# Patient Record
Sex: Male | Born: 1935
Health system: Southern US, Community
[De-identification: ages and names within clinical notes are randomized; demographics above are authoritative.]

## PROBLEM LIST (undated history)

## (undated) DIAGNOSIS — I82401 Acute embolism and thrombosis of unspecified deep veins of right lower extremity: Secondary | ICD-10-CM

## (undated) DIAGNOSIS — E119 Type 2 diabetes mellitus without complications: Secondary | ICD-10-CM

## (undated) DIAGNOSIS — C61 Malignant neoplasm of prostate: Secondary | ICD-10-CM

## (undated) DIAGNOSIS — I5189 Other ill-defined heart diseases: Secondary | ICD-10-CM

## (undated) DIAGNOSIS — I10 Essential (primary) hypertension: Secondary | ICD-10-CM

## (undated) DIAGNOSIS — I4891 Unspecified atrial fibrillation: Secondary | ICD-10-CM

## (undated) DIAGNOSIS — I2692 Saddle embolus of pulmonary artery without acute cor pulmonale: Secondary | ICD-10-CM

---

## 2008-08-02 ENCOUNTER — Inpatient Hospital Stay (HOSPITAL_COMMUNITY): Admission: EM | Admit: 2008-08-02 | Discharge: 2008-08-06 | Payer: Self-pay | Admitting: Emergency Medicine

## 2008-08-11 ENCOUNTER — Encounter: Admission: RE | Admit: 2008-08-11 | Discharge: 2008-08-11 | Payer: Self-pay | Admitting: Geriatric Medicine

## 2008-08-12 ENCOUNTER — Inpatient Hospital Stay (HOSPITAL_COMMUNITY): Admission: AD | Admit: 2008-08-12 | Discharge: 2008-08-17 | Payer: Self-pay | Admitting: Internal Medicine

## 2008-08-16 ENCOUNTER — Encounter (INDEPENDENT_AMBULATORY_CARE_PROVIDER_SITE_OTHER): Payer: Self-pay | Admitting: Internal Medicine

## 2008-10-18 ENCOUNTER — Encounter: Admission: RE | Admit: 2008-10-18 | Discharge: 2008-10-18 | Payer: Self-pay | Admitting: Geriatric Medicine

## 2009-01-28 ENCOUNTER — Encounter: Admission: RE | Admit: 2009-01-28 | Discharge: 2009-01-28 | Payer: Self-pay | Admitting: Geriatric Medicine

## 2011-02-06 NOTE — H&P (Signed)
[name], MATTERS NO.:  [id number]   MEDICAL RECORD NO.:  [id number]          PATIENT TYPE:  EMS   LOCATION:  MAJO                         FACILITY:  [facility]   PHYSICIAN:  Hollice Espy, M.D.DATE OF BIRTH:  03-Mar-1919   DATE OF ADMISSION:  08/02/2008  DATE OF DISCHARGE:                              HISTORY & PHYSICAL   PRIMARY CARE PHYSICIAN:  Dr. Merlene Laughter.   CHIEF COMPLAINT:  Cough and shortness of breath.   HISTORY OF PRESENT ILLNESS:  The patient is an 77 year old white male  with past medical history of diabetes and hypertension who for the past  5 days has had problems with progressively worsening shortness of breath  and productive cough of brownish-greenish sputum as well as some  hemoptysis.  Things got bad.  The patient felt very weak, could barely  move and came to the emergency room for further evaluation.  In the  emergency room, he was noted on admission to have a temperature of 99.7,  heart rate of 110 and respiratory rate of 22.  His O2 saturations were,  however, ample at 97% on 2 L.  Labs were ordered on the patient.  He was  found to have a white count 15.9 with a 95% shift, greater than 20%  bandemia, vacuolated neutrophils indicating early signs of sepsis.  His  other labs were essentially unremarkable except for an albumin of 2.3,  INR of 1.3, a CPK greater than 500 with MB of 10.8, normal troponin.  The patient was given a dose of Rocephin.  Currently, he is feeling  weak.  He denies any headaches, vision changes, dysphagia, chest pain,  palpitations.  He does complain of shortness of breath with wheezing and  productive cough with green, brown and red sputum.  He complains of no  abdominal pain, does feel nauseated and says that earlier on, he was  having diarrhea for several days but has not had any for the last 1-1/2  days.  He denies any constipation.  He denies any hematuria or dysuria,  no focal extremity numbness,  weakness or pain.  Overall, he feels very  fatigued.   REVIEW OF SYSTEMS:  Otherwise negative.   PAST MEDICAL HISTORY:  Includes diabetes mellitus, type 2, poorly  controlled, hypertension and GERD.   MEDICATIONS:  1. Lisinopril 10.  2. Loratadine 10.  3. Glucosamine daily.  4. Aspirin 81.  5. Glipizide 2.5 daily.  6. Finasteride 5.  7. Prilosec 20.   He has no known drug allergies, although he reports food allergies to  CHOCOLATE and SHRIMP.   SOCIAL HISTORY:  He denies any tobacco, alcohol or drug use.  He and his  wife recently relocated from [place] where they lived at the [facility].   FAMILY HISTORY:  Noncontributory.   PHYSICAL EXAMINATION:  VITALS ON ADMISSION:  Temperature 99.7, heart  rate 110, blood pressure 119/72, respirations 22, O2 saturation 96% on 2  L.  IN GENERAL:  He is alert and oriented x3, in no apparent distress.  HEENT:  Normocephalic, atraumatic.  His mucous  membranes are slightly  dry.  NECK:  He has no carotid bruits.  HEART:  Irregular rhythm, mild tachycardia.  LUNGS:  He has some scattered rales and wheezing.  ABDOMEN:  Soft, nontender, nondistended.  Positive bowel sounds.  EXTREMITIES:  Show no clubbing or cyanosis.  He has a 1+ pitting edema  bilaterally from the knees down.   LABORATORY WORK:  White count 15.9, hemoglobin and hematocrit 12.6 and  38 respectively, MCV of 92, platelet count 236, 95% shift, vacuolated  neutrophils, and greater than 20% bandemia is present.  Sodium 136,  potassium 4.3, chloride 107, bicarb 20, BUN 24, creatinine 1.08, glucose  115.  LFTs are noted for albumin of 2.3.  INR is elevated at 1.3 with a  PTT of 54.  UA notes small leukocyte esterase, 100 of protein, 4 or  urobilinogen, otherwise unremarkable.  CPK greater than 500, MB 10.8,  troponin-I of 0.05.   ASSESSMENT AND PLAN:  1. Nursing home-acquired pneumonia with #2.  2. SIRS.  We will plan to treat with intravenous fluids, antibiotics,       specifically choosing vancomycin and Avelox given his nursing home      state plus oxygen and nebulizer treatments.  3. Diabetes mellitus.  Check an A1c, sliding scale insulin and      glipizide for now.  Depending on the patient's p.o. intake, will      adjust accordingly.  4. Hypertension.  Currently holding lisinopril given his slightly low      blood pressure on admission.  5. Gastroesophageal reflux disease.  Continue Prilosec.      Hollice Espy, M.D.  Electronically Signed     SKK/MEDQ  D:  08/02/2008  T:  08/02/2008  Job:  [id number]   cc:   Hal T. Stoneking, M.D.

## 2011-02-06 NOTE — H&P (Signed)
NAMEMAXIMILIAN, TALLO                 ACCOUNT NO.:  [id number]   MEDICAL RECORD NO.:  [id number]          PATIENT TYPE:  INP   LOCATION:  4728                         FACILITY:  [facility]   PHYSICIAN:  Hal T. Stoneking, M.D. DATE OF BIRTH:  December 03, 1918   DATE OF ADMISSION:  08/02/2008  DATE OF DISCHARGE:  08/06/2008                              HISTORY & PHYSICAL   ADMISSION DIAGNOSIS:  Pneumonia.   HISTORY OF PRESENT ILLNESS:  Mr. Wieck is a very pleasant 77 year old  white male who is a recent new resident at the [facility] in independent living.  He had a respiratory illness and was  admitted on August 02, 2008, with a left lower lobe pneumonia.  He was  treated in the hospital at that time.  He was seen to be improving and  was discharged on Avelox.  He has been seen in the clinic 2 times since  his discharge and has had persistent problems with fever, increasing  cough, and weakness.  He did have a BNP that was elevated at 319 and  normal BMET, these were drawn on August 11, 2008.  He was placed on  increasing doses of Lasix to 80 mg b.i.d. and has not had an effective  diuresis during that time.  He is seen this morning and again is having  increasing problems with weakness, shortness of breath, and occasionally  feeling hot and chilled.  He is coughing up yellow-tinged sputum.   PAST MEDICAL HISTORY:  He is allergic to the flu shot.  He did have a  Pneumovax in 2001.   CURRENT MEDICATIONS:  1. Lasix 80 mg b.i.d., which he has been receiving only for 24 hours.  2. Lisinopril 10 mg a day.  3. Glipizide 2.5 mg a day.  4. Finasteride 5 mg a day.  5. Omeprazole 20 mg b.i.d.  6. Aspirin 81 mg a day.  7. Glucosamine sulfate 2000 mg a day.   Past medical history remarkable for:  1. Diabetes mellitus.  2. Hypertension.  3. Prostate cancer in situ.  4. Peripheral neuropathy.  5. GERD.  6. Allergic rhinitis.   SURGICAL HISTORY:  1. Status post  appendectomy.  2. Cholecystectomy.  3. Prostate biopsy.   SOCIAL HISTORY:  The patient is married.  He lives at the [facility]  in independent living.  He is retired.  He stopped smoking in the 1950s.  He does not consume alcohol.   FAMILY HISTORY:  Brother died with esophageal cancer, another brother  had gastric cancer, and mother died at age 77 of colon cancer.  Father  died at age 72 of a stroke.   HEALTH MAINTENANCE:  He states that has been over 5 years since he has  had a colonoscopy.   REVIEW OF SYSTEMS:  Currently, no headache.  States his bowels are  moving normally.  He has had no nausea or vomiting.   PHYSICAL EXAMINATION:  VITAL SIGNS:  Weight 182, temperature 97.8, O2  sat 91% on room air, blood pressure 170/60, pulse 92.  HEENT:  Unremarkable.  LUNGS:  Crackles and decreased breath sounds at the left base.  HEART:  Regular rate and rhythm without murmur.  ABDOMEN:  Obese, soft, nontender.  LEGS:  A 3+ pedal edema.   LABORATORY DATA:  Chest x-ray repeated on August 11, 2008, revealed  worsening left greater than right airspace opacities compatible with  worsening pneumonia.   ASSESSMENT:  1. Persistent community-acquired pneumonia currently failing      outpatient Avelox treatment after inpatient treatment.  We would      possibly consider infectious disease input regarding antibiotic      coverage at this point.  2. Diabetes mellitus, follow CBGs.  3. Increased volume status by physical exam and BNP although that may      be misleading given the fact he has pneumonia.  No prior history of      congestive heart failure.   PLAN:  Please admit and begin IV Lasix 80 mg b.i.d., follow his CBGs and  await for input regarding antibiotic coverage.           ______________________________  Sunday Spillers Pete Glatter, M.D.     HTS/MEDQ  D:  08/12/2008  T:  08/12/2008  Job:  [id number]

## 2011-02-06 NOTE — Discharge Summary (Signed)
Matthew Velez, Matthew Velez                 ACCOUNT NO.:  [id number]   MEDICAL RECORD NO.:  [id number]          PATIENT TYPE:  INP   LOCATION:  5013                         FACILITY:  [facility]   PHYSICIAN:  Kela Millin, M.D.DATE OF BIRTH:  07/01/1919   DATE OF ADMISSION:  08/12/2008  DATE OF DISCHARGE:  08/17/2008                               DISCHARGE SUMMARY   DISCHARGE DIAGNOSES:  1. Bilateral pneumonia, left greater than right.  2. Fluid overload/peripheral edema.  Improved with Lasix, 2-D echo      results pending at the time of this dictation.  3. Hypokalemia.  Potassium replaced.  4. Diabetes mellitus.  5. Hypertension.  6. History of prostate carcinoma in situ.  7. Peripheral neuropathy.  8. Gastroesophageal reflux disease.  9. History of allergic rhinitis.   STUDIES:  Chest x-ray on 11/18, worsening left greater than right air  space opacities compatible with worsening pneumonia.   BRIEF HISTORY:  The patient is an 77 year old white male resident of  [facility]  independent living who presented with complaints of increasing  cough, weakness and fevers.  It was noted that the patient had had a  respiratory illness and was admitted to the hospital August 02, 2008,  and his x-ray showed a left lower lobe pneumonia treated with  antibiotics, and as he was improving, he was discharged home on Avelox.  He followed up at the clinic after his discharge and had lab work on  November 18 that showed a brain natruretic peptide of 319.  A chest x-  ray was done, and the results as stated above.  The patient was started  on p.o. Lasix 24 hours prior to his readmission.  His symptoms were  continuing to worsen and he was directly admitted for further evaluation  and management.   Please see the full admission history and physical dictated on August 12, 2008, for the details of the admission and physical exam as well as  the laboratory data.   HOSPITAL COURSE:  1. Bilateral pneumonia.   Upon admission, the patient was empirically      started on vancomycin and Zosyn to cover for health care associated      pneumonia after blood and sputum cultures were drawn.  The blood      cultures to date are negative, and the patient's sputum showed gram      positive cocci in pairs but no resistant organisms reported.  He      has remained afebrile throughout his hospital stay and his      leukocytosis has resolved.  His last white cell count prior to      discharge is 9.0.  His energy levels are improved and he is      tolerating p.o. well.  With his blood cultures negative and no      resistant organisms in his sputum, the vancomycin was discontinued.      He was monitored on the Zosyn and has remained afebrile.  He will      be discharged on Augmentin, and he is to follow up with  his primary      care physician, he has been oxygenating well on room air.  2. Volume overload/peripheral edema.  Upon admission, the patient was      started on IV Lasix.  He has diuresed well with significant      improvement of his peripheral edema and his Lasix was changed to      p.o.  His creatinine went from 1.19-1.32 and so his Lasix dose has      been decreased from 80 mg b.i.d. to 40 mg b.i.d.  He is to follow      up with his primary care physician and his renal function to be      monitored and the Lasix a dose adjusted as appropriate.  A 2-D      echocardiogram was also done while the patient was in the hospital      and the results are pending at the time of this dictation, he is to      follow up with Dr. Pete Glatter for these results.  3. Hypokalemia.  Potassium was replaced in the hospital.  4. Hypertension.  He is to continue his outpatient medications upon      discharge.  5. Diabetes.  Was maintained on glipizide during his hospital stay.  6. GERD.  He was maintained on a PPI and is to continue his omeprazole      upon discharge.  7. History of prostate cancer in situ, is to follow up  outpatient.   DISCHARGE MEDICATIONS:  1. Augmentin 875 mg p.o. daily for eight more days.  2. Lasix 40 mg p.o. b.i.d.  3. Patient to continue glipizide 2.5 mg daily.  4. Lisinopril 10 mg daily.  5. Finasteride 5 mg daily.  6. Omeprazole 20 mg b.i.d.  7. Aspirin 81 mg daily.  8. Glucosamine sulfate 2000 mg per day.   PENDING LABORATORY DATA:  Echocardiogram.   FOLLOW-UP CARE:  1. Dr. Merlene Laughter in 3-5 days, recheck b-met for followup of BUN      and creatinine.   DISCHARGE CONDITION:  Improved.  Stable.  Home health PT is to follow  the patient.      Kela Millin, M.D.  Electronically Signed     ACV/MEDQ  D:  08/17/2008  T:  08/17/2008  Job:  [id number]   cc:   Hal T. Stoneking, M.D.

## 2011-02-09 NOTE — Discharge Summary (Signed)
Matthew Velez, Matthew Velez                 ACCOUNT NO.:  [id number]   MEDICAL RECORD NO.:  [id number]          PATIENT TYPE:  INP   LOCATION:  4728                         FACILITY:  [facility]   PHYSICIAN:  Corinna L. Lendell Caprice, MDDATE OF BIRTH:  06-May-1919   DATE OF ADMISSION:  08/02/2008  DATE OF DISCHARGE:  08/06/2008                               DISCHARGE SUMMARY   DISCHARGE DIAGNOSES:  1. Left upper lobe pneumonia.  2. Diabetes, type 2.  3. Hypertension.  4. Gastroesophageal reflux disease.   DISCHARGE MEDICATIONS:  1. Avelox 400 mg daily until gone.  2. Tessalon Perles one or two every 8 hours as needed for cough.  3. Continue lisinopril 10 mg a day.  4. Glipizide 2.5 mg a day.  5. Finasteride 5 mg a day.  6. Omeprazole 20 mg a day.  7. Aspirin 81 mg a day.  8. Glucosamine loratadine as needed.   CONDITION:  Stable.   Followup with Dr. Corbin Ade in 4 weeks.  He will need a repeat chest x-  ray in 4-6 weeks.  Increase activity slowly.  Diet should be low-salt  diabetic.   CONSULTATIONS:  None.   PROCEDURES:  None.   LABORATORY DATA:  Initial white blood cell count was 16,000 with 91%  neutrophils, 3% lymphocytes.  At discharge, his white blood cell count  was 13,000.  He had a BUN of 24 when he came in.  It was 11 when he was  discharged, otherwise unremarkable basic metabolic panel.  His PTT was  5.4, INR 1.3.  Liver enzymes significant for an albumin of 2.3, total  protein 5.7, and hemoglobin A1c was 6.7.  Troponin negative.  Urinalysis  showed nitrite negative, small leukocyte esterase 4 bilinogen, 100  protein, 0-2 white cells, 0-2 red cells, rare bacteria.  Blood cultures  negative.  Myoglobin greater than 500.   SPECIAL STUDIES/RADIOLOGY:  Chest x-ray on admission showed left upper  lobe pneumonia, nodularity in both hilar region, maybe vasculature or  airspace disease versus lymphadenopathy, modified barium swallow showed  no aspiration.   HISTORY AND HOSPITAL  COURSE:  Mr. Toth is a pleasant 77 year old  gentleman who was admitted to the hospital with pneumonia.  He is a  patient of Dr. Corbin Ade.  He had temperature of 99.7 rectally, pulse  110, respiratory rate 22, normal blood pressure.  He was started on  antibiotics.  There was some concern that he coughs when he eats, so  speech was consulted, performed modified barium swallow and recommended  reflux precautions  and regular consistency diet.  At discharge, he was tolerating a diet.  He had normal vital signs.  He was afebrile on oral antibiotics.  His  cough and shortness of breath had improved.  He is ambulatory pulse ox  was 94% and he was discharged home.      Corinna L. Lendell Caprice, MD  Electronically Signed     CLS/MEDQ  D:  09/30/2008  T:  09/30/2008  Job:  [id number]

## 2011-03-19 ENCOUNTER — Other Ambulatory Visit: Payer: Self-pay | Admitting: Geriatric Medicine

## 2011-03-19 DIAGNOSIS — K219 Gastro-esophageal reflux disease without esophagitis: Secondary | ICD-10-CM

## 2011-03-20 ENCOUNTER — Ambulatory Visit
Admission: RE | Admit: 2011-03-20 | Discharge: 2011-03-20 | Disposition: A | Discharge status: Home / Self Care | Payer: Medicare HMO | Source: Ambulatory Visit | Attending: Geriatric Medicine | Admitting: Geriatric Medicine

## 2011-03-20 DIAGNOSIS — K219 Gastro-esophageal reflux disease without esophagitis: Secondary | ICD-10-CM

## 2011-06-26 LAB — DIFFERENTIAL
Basophils Absolute: 0
Basophils Absolute: 0
Basophils Relative: 0
Eosinophils Absolute: 0.2
Eosinophils Relative: 1
Eosinophils Relative: 1
Eosinophils Relative: 2
Lymphocytes Relative: 3 — ABNORMAL LOW
Lymphocytes Relative: 8 — ABNORMAL LOW
Lymphs Abs: 1
Lymphs Abs: 1
Monocytes Absolute: 1
Monocytes Absolute: 1
Monocytes Absolute: 1
Monocytes Relative: 7
Neutro Abs: 10.4 — ABNORMAL HIGH
Neutrophils Relative %: 95 — ABNORMAL HIGH

## 2011-06-26 LAB — URINALYSIS, ROUTINE W REFLEX MICROSCOPIC
Bilirubin Urine: NEGATIVE
Ketones, ur: NEGATIVE
pH: 6

## 2011-06-26 LAB — GLUCOSE, CAPILLARY
Glucose-Capillary: 100 — ABNORMAL HIGH
Glucose-Capillary: 101 — ABNORMAL HIGH
Glucose-Capillary: 102 — ABNORMAL HIGH
Glucose-Capillary: 102 — ABNORMAL HIGH
Glucose-Capillary: 102 — ABNORMAL HIGH
Glucose-Capillary: 106 — ABNORMAL HIGH
Glucose-Capillary: 108 — ABNORMAL HIGH
Glucose-Capillary: 111 — ABNORMAL HIGH
Glucose-Capillary: 116 — ABNORMAL HIGH
Glucose-Capillary: 128 — ABNORMAL HIGH
Glucose-Capillary: 130 — ABNORMAL HIGH
Glucose-Capillary: 132 — ABNORMAL HIGH
Glucose-Capillary: 60 — ABNORMAL LOW
Glucose-Capillary: 77
Glucose-Capillary: 80
Glucose-Capillary: 80
Glucose-Capillary: 91
Glucose-Capillary: 96
Glucose-Capillary: 96
Glucose-Capillary: 98
Glucose-Capillary: <10 — CL

## 2011-06-26 LAB — CBC
HCT: 31.7 — ABNORMAL LOW
HCT: 32.1 — ABNORMAL LOW
HCT: 34.1 — ABNORMAL LOW
HCT: 35 — ABNORMAL LOW
HCT: 35.8 — ABNORMAL LOW
HCT: 37.5 — ABNORMAL LOW
Hemoglobin: 10.8 — ABNORMAL LOW
Hemoglobin: 11 — ABNORMAL LOW
MCHC: 33.4
MCHC: 33.4
MCHC: 33.6
MCV: 91.2
MCV: 91.3
MCV: 92.3
Platelets: 236
Platelets: 272
Platelets: 310
Platelets: 581 — ABNORMAL HIGH
Platelets: 595 — ABNORMAL HIGH
Platelets: 599 — ABNORMAL HIGH
RBC: 3.48 — ABNORMAL LOW
RBC: 3.76 — ABNORMAL LOW
RBC: 4.07 — ABNORMAL LOW
RDW: 14
RDW: 14.4
RDW: 14.6
RDW: 14.8
RDW: 14.8
WBC: 12.5 — ABNORMAL HIGH
WBC: 12.7 — ABNORMAL HIGH
WBC: 13.6 — ABNORMAL HIGH
WBC: 15.9 — ABNORMAL HIGH
WBC: 16.8 — ABNORMAL HIGH

## 2011-06-26 LAB — COMPREHENSIVE METABOLIC PANEL
ALT: 22
AST: 32
Albumin: 1.9 — ABNORMAL LOW
Alkaline Phosphatase: 70
BUN: 11
BUN: 24 — ABNORMAL HIGH
Chloride: 106
Chloride: 107
Creatinine, Ser: 1.08
GFR calc Af Amer: >60
Potassium: 4.3
Potassium: 4.3
Sodium: 136
Total Bilirubin: 0.7

## 2011-06-26 LAB — BASIC METABOLIC PANEL
BUN: 11
BUN: 12
BUN: 21
CO2: 23
CO2: 27
CO2: 29
Calcium: 7.6 — ABNORMAL LOW
Calcium: 8.1 — ABNORMAL LOW
Chloride: 100
Chloride: 104
Chloride: 106
Chloride: 109
Creatinine, Ser: 1.08
GFR calc Af Amer: >60
GFR calc Af Amer: >60
GFR calc non Af Amer: >60
GFR calc non Af Amer: >60
Glucose, Bld: 102 — ABNORMAL HIGH
Glucose, Bld: 111 — ABNORMAL HIGH
Glucose, Bld: 83
Glucose, Bld: 98
Potassium: 3.3 — ABNORMAL LOW
Potassium: 3.5
Potassium: 3.6
Potassium: 4.2
Sodium: 138
Sodium: 139
Sodium: 139

## 2011-06-26 LAB — CARDIAC PANEL(CRET KIN+CKTOT+MB+TROPI)
CK, MB: 6.1 — ABNORMAL HIGH
Relative Index: 2.8 — ABNORMAL HIGH
Troponin I: 0.06

## 2011-06-26 LAB — POCT CARDIAC MARKERS
CKMB, poc: 10.8
Troponin i, poc: <0.05

## 2011-06-26 LAB — CULTURE, RESPIRATORY W GRAM STAIN

## 2011-06-26 LAB — CULTURE, BLOOD (ROUTINE X 2): Culture: NO GROWTH

## 2011-06-26 LAB — EXPECTORATED SPUTUM ASSESSMENT W GRAM STAIN, RFLX TO RESP C

## 2011-06-26 LAB — URINE MICROSCOPIC-ADD ON

## 2011-06-26 LAB — APTT: aPTT: 54 — ABNORMAL HIGH

## 2011-06-26 LAB — PROTIME-INR: Prothrombin Time: 16.6 — ABNORMAL HIGH

## 2011-06-26 LAB — HEMOGLOBIN A1C: Hgb A1c MFr Bld: 6.7 — ABNORMAL HIGH

## 2013-02-19 ENCOUNTER — Inpatient Hospital Stay (HOSPITAL_COMMUNITY)
Admission: EM | Admit: 2013-02-19 | Discharge: 2013-02-26 | DRG: 299 | Disposition: A | Discharge status: Home / Self Care | Payer: Medicare HMO | Attending: Internal Medicine | Admitting: Internal Medicine

## 2013-02-19 ENCOUNTER — Emergency Department (HOSPITAL_COMMUNITY): Payer: Medicare HMO

## 2013-02-19 ENCOUNTER — Encounter (HOSPITAL_COMMUNITY): Payer: Self-pay | Admitting: Internal Medicine

## 2013-02-19 DIAGNOSIS — I4891 Unspecified atrial fibrillation: Secondary | ICD-10-CM

## 2013-02-19 DIAGNOSIS — I824Z9 Acute embolism and thrombosis of unspecified deep veins of unspecified distal lower extremity: Principal | ICD-10-CM | POA: Diagnosis present

## 2013-02-19 DIAGNOSIS — I519 Heart disease, unspecified: Secondary | ICD-10-CM | POA: Diagnosis present

## 2013-02-19 DIAGNOSIS — M7989 Other specified soft tissue disorders: Secondary | ICD-10-CM

## 2013-02-19 DIAGNOSIS — Z87891 Personal history of nicotine dependence: Secondary | ICD-10-CM

## 2013-02-19 DIAGNOSIS — I10 Essential (primary) hypertension: Secondary | ICD-10-CM

## 2013-02-19 DIAGNOSIS — I82409 Acute embolism and thrombosis of unspecified deep veins of unspecified lower extremity: Secondary | ICD-10-CM

## 2013-02-19 DIAGNOSIS — I2789 Other specified pulmonary heart diseases: Secondary | ICD-10-CM | POA: Diagnosis present

## 2013-02-19 DIAGNOSIS — Z79899 Other long term (current) drug therapy: Secondary | ICD-10-CM

## 2013-02-19 DIAGNOSIS — I2699 Other pulmonary embolism without acute cor pulmonale: Secondary | ICD-10-CM

## 2013-02-19 DIAGNOSIS — I5189 Other ill-defined heart diseases: Secondary | ICD-10-CM

## 2013-02-19 DIAGNOSIS — E119 Type 2 diabetes mellitus without complications: Secondary | ICD-10-CM

## 2013-02-19 DIAGNOSIS — Z7982 Long term (current) use of aspirin: Secondary | ICD-10-CM

## 2013-02-19 DIAGNOSIS — Z66 Do not resuscitate: Secondary | ICD-10-CM | POA: Diagnosis present

## 2013-02-19 DIAGNOSIS — C61 Malignant neoplasm of prostate: Secondary | ICD-10-CM

## 2013-02-19 DIAGNOSIS — I2692 Saddle embolus of pulmonary artery without acute cor pulmonale: Secondary | ICD-10-CM

## 2013-02-19 DIAGNOSIS — I82401 Acute embolism and thrombosis of unspecified deep veins of right lower extremity: Secondary | ICD-10-CM | POA: Diagnosis present

## 2013-02-19 HISTORY — DX: Type 2 diabetes mellitus without complications: E11.9

## 2013-02-19 HISTORY — DX: Saddle embolus of pulmonary artery without acute cor pulmonale: I26.92

## 2013-02-19 HISTORY — DX: Unspecified atrial fibrillation: I48.91

## 2013-02-19 HISTORY — DX: Essential (primary) hypertension: I10

## 2013-02-19 HISTORY — DX: Malignant neoplasm of prostate: C61

## 2013-02-19 HISTORY — DX: Other ill-defined heart diseases: I51.89

## 2013-02-19 HISTORY — DX: Acute embolism and thrombosis of unspecified deep veins of right lower extremity: I82.401

## 2013-02-19 LAB — GLUCOSE, CAPILLARY
Glucose-Capillary: 106 mg/dL — ABNORMAL HIGH (ref 70–99)
Glucose-Capillary: 149 mg/dL — ABNORMAL HIGH (ref 70–99)

## 2013-02-19 LAB — POCT I-STAT, CHEM 8
BUN: 15 mg/dL (ref 6–23)
Creatinine, Ser: 1 mg/dL (ref 0.50–1.35)
Glucose, Bld: 126 mg/dL — ABNORMAL HIGH (ref 70–99)
Hemoglobin: 13.9 g/dL (ref 13.0–17.0)
Potassium: 3.8 mEq/L (ref 3.5–5.1)
TCO2: 31 mmol/L (ref 0–100)

## 2013-02-19 LAB — CBC WITH DIFFERENTIAL/PLATELET
Basophils Relative: 1 % (ref 0–1)
Eosinophils Absolute: 0.3 10*3/uL (ref 0.0–0.7)
MCH: 30.2 pg (ref 26.0–34.0)
MCHC: 33.6 g/dL (ref 30.0–36.0)
Neutrophils Relative %: 71 % (ref 43–77)
Platelets: 169 10*3/uL (ref 150–400)

## 2013-02-19 LAB — APTT: aPTT: 31 seconds (ref 24–37)

## 2013-02-19 LAB — PROTIME-INR: Prothrombin Time: 13.8 seconds (ref 11.6–15.2)

## 2013-02-19 MED ORDER — HYDROCODONE-ACETAMINOPHEN 5-325 MG PO TABS: 1.0000 | ORAL_TABLET | ORAL | Status: DC | PRN

## 2013-02-19 MED ORDER — ACETAMINOPHEN 325 MG PO TABS: 650.0000 mg | ORAL_TABLET | Freq: Four times a day (QID) | ORAL | Status: DC | PRN

## 2013-02-19 MED ORDER — PANTOPRAZOLE SODIUM 40 MG PO TBEC
40.0000 mg | DELAYED_RELEASE_TABLET | Freq: Every day | ORAL | Status: DC
  Administered 2013-02-19 – 2013-02-26 (×8): 40 mg via ORAL
  Filled 2013-02-19 (×8): qty 1

## 2013-02-19 MED ORDER — SODIUM CHLORIDE 0.9 % IJ SOLN
3.0000 mL | Freq: Two times a day (BID) | INTRAMUSCULAR | Status: DC
  Administered 2013-02-19 – 2013-02-20 (×2): 3 mL via INTRAVENOUS

## 2013-02-19 MED ORDER — SODIUM CHLORIDE 0.9 % IV SOLN
Freq: Once | INTRAVENOUS | Status: AC
  Administered 2013-02-19: 11:00:00 via INTRAVENOUS

## 2013-02-19 MED ORDER — METHYLPREDNISOLONE SODIUM SUCC 125 MG IJ SOLR
125.0000 mg | Freq: Once | INTRAMUSCULAR | Status: AC
  Administered 2013-02-19: 125 mg via INTRAVENOUS
  Filled 2013-02-19: qty 2

## 2013-02-19 MED ORDER — WARFARIN SODIUM 2.5 MG PO TABS
2.5000 mg | ORAL_TABLET | ORAL | Status: AC
  Administered 2013-02-19: 2.5 mg via ORAL
  Filled 2013-02-19: qty 1

## 2013-02-19 MED ORDER — WARFARIN - PHARMACIST DOSING INPATIENT
Freq: Every day | Status: DC
  Administered 2013-02-20 – 2013-02-22 (×2)

## 2013-02-19 MED ORDER — ENOXAPARIN SODIUM 80 MG/0.8ML ~~LOC~~ SOLN
1.0000 mg/kg | Freq: Two times a day (BID) | SUBCUTANEOUS | Status: DC
  Filled 2013-02-19 (×2): qty 0.8

## 2013-02-19 MED ORDER — ENOXAPARIN SODIUM 80 MG/0.8ML ~~LOC~~ SOLN
1.0000 mg/kg | Freq: Two times a day (BID) | SUBCUTANEOUS | Status: DC
  Administered 2013-02-19: 75 mg via SUBCUTANEOUS
  Administered 2013-02-20: 11:00:00 via SUBCUTANEOUS
  Administered 2013-02-20 – 2013-02-24 (×9): 75 mg via SUBCUTANEOUS
  Filled 2013-02-19 (×14): qty 0.8

## 2013-02-19 MED ORDER — IOHEXOL 350 MG/ML SOLN
100.0000 mL | Freq: Once | INTRAVENOUS | Status: AC | PRN
  Administered 2013-02-19: 100 mL via INTRAVENOUS

## 2013-02-19 MED ORDER — ONDANSETRON HCL 4 MG/2ML IJ SOLN: 4.0000 mg | Freq: Three times a day (TID) | INTRAMUSCULAR | Status: DC | PRN

## 2013-02-19 MED ORDER — DIPHENHYDRAMINE HCL 50 MG/ML IJ SOLN
25.0000 mg | Freq: Once | INTRAMUSCULAR | Status: AC
  Administered 2013-02-19: 25 mg via INTRAVENOUS
  Filled 2013-02-19: qty 1

## 2013-02-19 MED ORDER — COUMADIN BOOK
1.0000 | Freq: Once | Status: AC
  Administered 2013-02-19: 1
  Filled 2013-02-19: qty 1

## 2013-02-19 MED ORDER — ACETAMINOPHEN 650 MG RE SUPP: 650.0000 mg | Freq: Four times a day (QID) | RECTAL | Status: DC | PRN

## 2013-02-19 MED ORDER — WARFARIN VIDEO
1.0000 | Freq: Once | Status: AC
  Administered 2013-02-20: 1

## 2013-02-19 MED ORDER — ASPIRIN 81 MG PO CHEW
324.0000 mg | CHEWABLE_TABLET | Freq: Once | ORAL | Status: AC
  Administered 2013-02-19: 324 mg via ORAL
  Filled 2013-02-19: qty 4

## 2013-02-19 MED ORDER — GLIPIZIDE 5 MG PO TABS
5.0000 mg | ORAL_TABLET | Freq: Two times a day (BID) | ORAL | Status: DC
  Administered 2013-02-20 – 2013-02-26 (×13): 5 mg via ORAL
  Filled 2013-02-19 (×16): qty 1

## 2013-02-19 MED ORDER — FINASTERIDE 5 MG PO TABS
5.0000 mg | ORAL_TABLET | Freq: Every day | ORAL | Status: DC
  Administered 2013-02-19 – 2013-02-26 (×8): 5 mg via ORAL
  Filled 2013-02-19 (×8): qty 1

## 2013-02-19 MED ORDER — SODIUM CHLORIDE 0.9 % IJ SOLN: 3.0000 mL | INTRAMUSCULAR | Status: DC | PRN

## 2013-02-19 MED ORDER — SODIUM CHLORIDE 0.9 % IV SOLN: 250.0000 mL | INTRAVENOUS | Status: DC | PRN

## 2013-02-19 MED ORDER — ENOXAPARIN SODIUM 100 MG/ML ~~LOC~~ SOLN
1.0000 mg/kg | SUBCUTANEOUS | Status: AC
  Administered 2013-02-19: 75 mg via SUBCUTANEOUS
  Filled 2013-02-19: qty 1

## 2013-02-19 MED ORDER — FUROSEMIDE 40 MG PO TABS
40.0000 mg | ORAL_TABLET | Freq: Every day | ORAL | Status: DC
  Administered 2013-02-19 – 2013-02-20 (×2): 40 mg via ORAL
  Filled 2013-02-19 (×2): qty 1

## 2013-02-19 MED ORDER — SENNOSIDES-DOCUSATE SODIUM 8.6-50 MG PO TABS
1.0000 | ORAL_TABLET | Freq: Every evening | ORAL | Status: DC | PRN
  Filled 2013-02-19: qty 1

## 2013-02-19 NOTE — ED Notes (Signed)
Checked patient blood sugar it was 106 notified RN Toniann Fail of blood sugar

## 2013-02-19 NOTE — Consult Note (Addendum)
ANTICOAGULATION CONSULT NOTE - Initial Consult  Pharmacy Consult:  Coumadin with Lovenox bridging Indication:  Saddle embolus,  acute DVT of the right lower extremity, new onset atrial fibrillation   Allergies: Allergies  Allergen Reactions  . Other     Flu shot causes Anaphylaxis  . Iodine Rash    Height/Weight: Weight: 167 lb 6.4 oz (75.932 kg) Dosing weight 75.9 kg  Vital Signs: BP 117/66  Pulse 88  Temp(Src) 97.9 F (36.6 C) (Oral)  Resp 17  Wt 167 lb 6.4 oz (75.932 kg)  SpO2 93%  Active Problems: Principal Problem:   Saddle pulmonary embolus Active Problems:   Atrial fibrillation, transient   Right leg DVT   DM type 2 (diabetes mellitus, type 2)   Prostate cancer   Benign hypertension   Diastolic dysfunction   Labs:  Recent Labs  02/19/13 1106 02/19/13 1120  HGB 13.7 13.9  HCT 40.8 41.0  PLT 169  --   APTT 31  --   LABPROT 13.8  --   INR 1.07  --   CREATININE  --  1.00   Lab Results  Component Value Date   INR 1.07 02/19/2013   Medical / Surgical History: Past Medical History  Diagnosis Date  . Atrial fibrillation, transient 02/19/2013  . Benign hypertension 02/19/2013  . Diastolic dysfunction 02/19/2013  . DM type 2 (diabetes mellitus, type 2) 02/19/2013  . Prostate cancer 02/19/2013  . Right leg DVT 02/19/2013  . Saddle pulmonary embolus 02/19/2013   No past surgical history on file.  Medications:  Prescriptions prior to admission  Medication Sig Dispense Refill  . aspirin EC 81 MG tablet Take 81 mg by mouth daily.      . finasteride (PROSCAR) 5 MG tablet Take 5 mg by mouth daily.      . furosemide (LASIX) 40 MG tablet Take 40 mg by mouth daily.      Marland Kitchen glipiZIDE (GLUCOTROL) 5 MG tablet Take 5 mg by mouth 2 (two) times daily before a meal.      . Glucosamine Sulfate 500 MG TABS Take 1 tablet by mouth daily.      Marland Kitchen lisinopril (PRINIVIL,ZESTRIL) 10 MG tablet Take 10 mg by mouth daily.      Marland Kitchen omeprazole (PRILOSEC) 20 MG capsule Take 20 mg by  mouth daily.       Scheduled:  . enoxaparin (LOVENOX) injection  1 mg/kg Subcutaneous Q12H  . finasteride  5 mg Oral Daily  . furosemide  40 mg Oral Daily  . [START ON 02/20/2013] glipiZIDE  5 mg Oral BID AC  . pantoprazole  40 mg Oral Daily  . sodium chloride  3 mL Intravenous Q12H  . sodium chloride  3 mL Intravenous Q12H   Assessment:  79 y.o.male with an acute saddle embolus, RLE DVT, and new onset of atrial fibrillation who is to be placed on Coumadin with Lovenox bridging [therapeutic doses].  Patient weight 75.9 kg, Baseline INR 1.07, Hgb 13.9, Plt's 169.  Est CrCl pending.  Goal of Therapy:   INR 2-3  Anti-Xa level 0.6-1.2 units/ml 4hrs after LMWH dose given      Plan:   Lovenox 75 mg sq x 1 , already given.    Coumadin 2.5 mg po now. Daily INR's, CBC. Monitor for bleeding complications  Follow up CrCl and redose Lovenox as indicated.  Kearston Putman, Elisha Headland,  Pharm.D.. 02/19/2013,  6:11 PM  ADDENDUM:  Assessment/Plan:  Estimated CrCl 46 ml/min.  No adjustments in Lovenox required  at this time.  Continue Lovenox bridging 1mg /kg [75 mg] q 12 hours minimum of 5 days overlap with Coumadin and a minimum of 24 - 48 hours of therapeutic INR.  Geraldina Parrott, Elisha Headland,  Pharm.D. 6:31 PM

## 2013-02-19 NOTE — ED Notes (Signed)
Pt in from [facility] for eval for syncopal episode yesterday of unknown time per EMS report, pt refused transport yesterday, per EMS report the pt went to PCP for eval & EMS was called for eval of new onset A fib, pt denies CP, SOB, n/v/d, pt A&O x 4, follows commands, speaks in complete sentences

## 2013-02-19 NOTE — ED Provider Notes (Signed)
History     CSN: [id number]  Arrival date & time 02/19/13  1011   First MD Initiated Contact with Patient 02/19/13 1016      Chief Complaint  Patient presents with  . Atrial Fibrillation    (Consider location/radiation/quality/duration/timing/severity/associated sxs/prior treatment) HPI Comments: 79 year old male with a history of high blood pressure, diabetes who presents with a complaint of right lower strumming swelling and a syncopal event. The patient was initially seen at his primary doctor's office this morning where they evaluated him and sent to the hospital for new onset atrial fibrillation. The patient states that yesterday while walking down the hallway at his living facility he felt lightheaded and dizzy, felt like the room was spinning and had to sit down in a chair. He then had a syncopal event witnessed by his wife which she does not remember. This was short lived but the patient was ill appearing and diaphoretic according to the spouse at the time. This kind of went away and over the rest of the day he felt generally weak. This morning he presented to his doctor's office with a swollen right ankle with mild erythema which is gradually worsening, thought to be cellulitis. His EKG showed atrial fibrillation which is why he was sent to the emergency department. The syncopal event was acute in onset, severe, resolved spontaneously and had no associated nausea vomiting chest pain back pain or fevers.  Patient is a 79 y.o. male presenting with atrial fibrillation. The history is provided by the patient and the spouse.  Atrial Fibrillation    No past medical history on file.  No past surgical history on file.  No family history on file.  History  Substance Use Topics  . Smoking status: Not on file  . Smokeless tobacco: Not on file  . Alcohol Use: Not on file      Review of Systems  All other systems reviewed and are negative.    Allergies  Other and Iodine  Home  Medications   Current Outpatient Rx  Name  Route  Sig  Dispense  Refill  . aspirin EC 81 MG tablet   Oral   Take 81 mg by mouth daily.         . finasteride (PROSCAR) 5 MG tablet   Oral   Take 5 mg by mouth daily.         . furosemide (LASIX) 40 MG tablet   Oral   Take 40 mg by mouth daily.         Marland Kitchen glipiZIDE (GLUCOTROL) 5 MG tablet   Oral   Take 5 mg by mouth 2 (two) times daily before a meal.         . Glucosamine Sulfate 500 MG TABS   Oral   Take 1 tablet by mouth daily.         Marland Kitchen lisinopril (PRINIVIL,ZESTRIL) 10 MG tablet   Oral   Take 10 mg by mouth daily.         Marland Kitchen omeprazole (PRILOSEC) 20 MG capsule   Oral   Take 20 mg by mouth daily.           BP 113/78  Pulse 99  Temp(Src) 97.8 F (36.6 C) (Oral)  Resp 20  Wt 167 lb 6.4 oz (75.932 kg)  SpO2 96%  Physical Exam  Nursing note and vitals reviewed. Constitutional: He appears well-developed and well-nourished. No distress.  HENT:  Head: Normocephalic and atraumatic.  Mouth/Throat: Oropharynx is clear and moist.  No oropharyngeal exudate.  Eyes: Conjunctivae and EOM are normal. Pupils are equal, round, and reactive to light. Right eye exhibits no discharge. Left eye exhibits no discharge. No scleral icterus.  Neck: Normal range of motion. Neck supple. No JVD present. No thyromegaly present.  Cardiovascular: Regular rhythm, normal heart sounds and intact distal pulses.  Exam reveals no gallop and no friction rub.   No murmur heard. Atrial fibrillation, pulse in the 80s, variable, strong pulses at the radial arteries  Pulmonary/Chest: Effort normal and breath sounds normal. No respiratory distress. He has no wheezes. He has no rales.  Abdominal: Soft. Bowel sounds are normal. He exhibits no distension and no mass. There is no tenderness.  Musculoskeletal: Normal range of motion. He exhibits edema ( Right lower extremity swelling with pitting edema from the knee through the foot, minimal scant  erythema around the right ankle, no petechiae or purpura). He exhibits no tenderness.  Lymphadenopathy:    He has no cervical adenopathy.  Neurological: He is alert. Coordination normal.  Skin: Skin is warm and dry. No rash noted. There is erythema.  Psychiatric: He has a normal mood and affect. His behavior is normal.    ED Course  Procedures (including critical care time)  Labs Reviewed  GLUCOSE, CAPILLARY - Abnormal; Notable for the following:    Glucose-Capillary 106 (*)    All other components within normal limits  POCT I-STAT, CHEM 8 - Abnormal; Notable for the following:    Glucose, Bld 126 (*)    All other components within normal limits  CBC WITH DIFFERENTIAL  APTT  PROTIME-INR  TROPONIN I   Dg Chest Port 1 View  02/19/2013   *RADIOLOGY REPORT*  Clinical Data: Chest pain.  PORTABLE CHEST - 1 VIEW  Comparison: 03/15/2010  Findings: Left base scarring or atelectasis, improved since prior study.  Right lung is clear.  Heart is upper limits normal in size. No effusions or acute bony abnormality.  IMPRESSION: Left base scarring or atelectasis.   Original Report Authenticated By: Charlett Nose, M.D.     1. Pulmonary embolism   2. DVT (deep venous thrombosis), right       MDM  The patient has paucity atrial fibrillation as well as having either cellulitis or DVT. The syncopal event raises some concern for the etiology including once arrhythmia or even a pulmonary embolism but there is a DVT. Currently the patient is hemodynamically stable, workup initiated with lab work, chest x-ray, ultrasound of the leg.  ED ECG REPORT  I personally interpreted this EKG   Date: 02/19/2013   Rate: 86  Rhythm: atrial fibrillation  QRS Axis: right  Intervals: normal  ST/T Wave abnormalities: nonspecific ST/T changes  Conduction Disutrbances:none  Narrative Interpretation:   Old EKG Reviewed: c/w prior - has RAD and afib.  CT scan of the chest confirms that the patient has a saddle  embolus. Thankfully his heart rate is around 100 and his blood pressure has remained fairly stable. Due to the large clot burden and the relative risk of this patient decompensating, he was given [place] ambulance early in his course, CT angiogram from study, discussed with hospitalist who agrees that the patient is in a step down unit due to his critical nature.  CRITICAL CARE Performed by: Vida Roller Total critical care time: 35 Critical care time was exclusive of separately billable procedures and treating other patients. Critical care was necessary to treat or prevent imminent or life-threatening deterioration. Critical care was time spent personally  by me on the following activities: development of treatment plan with patient and/or surrogate as well as nursing, discussions with consultants, evaluation of patient's response to treatment, examination of patient, obtaining history from patient or surrogate, ordering and performing treatments and interventions, ordering and review of laboratory studies, ordering and review of radiographic studies, pulse oximetry and re-evaluation of patient's condition.        Vida Roller, MD 02/19/13 [phone]

## 2013-02-19 NOTE — ED Notes (Signed)
Ct notified re: pt receiving solu-medrol & benadryl prior to completion of CT d/t listed Iodine allergy

## 2013-02-19 NOTE — Progress Notes (Signed)
VASCULAR LAB PRELIMINARY  PRELIMINARY  PRELIMINARY  PRELIMINARY  Right lower extremity venous Dopplers completed.    Preliminary report:  There is occlusive, acute DVT noted in the posterior tibial and popliteal veins of the right lower extremity.  Pacey Willadsen, RVT 02/19/2013, 11:50 AM

## 2013-02-19 NOTE — ED Notes (Signed)
Admitting MD at bedside.

## 2013-02-19 NOTE — H&P (Signed)
[facility] History and Physical  Matthew Velez WUJ:[id number] DOB: 04-13-1919 DOA: 02/19/2013  Referring physician:  Hyacinth Meeker PCP: Pete Glatter   Chief Complaint: Passed out yesterday. Right leg swelling. Sent from the office with atrial fibrillation.  HPI: Matthew Velez is a 79 y.o. male who was sent to the emergency room with atrial fibrillation. Apparently he converted to sinus rhythm in the emergency room.Marland Kitchen He has no previous history of atrial fibrillation. Patient's wife provides most of the history. She reports that about a week ago, she noticed his legs started swelling. Yesterday, he lost consciousness for about a minute or so. EMS was called but patient refused transfer to the emergency room. He was seen by a nurse practitioner and sent to the emergency room for further workup. In the ER, EKG showed atrial fib but evidence of right heart strain. Doppler of the leg showed DVT. CT angiogram of the chest shows saddle embolus, as mentioned below. The patient denies chest pain or shortness of breath. He denies palpitations. He has had no recent immobility or travel. He has no previous history of DVT or PE. He was recently diagnosed with prostate cancer, and the plan is for a nonaggressive approach due to his age. Patient has been started on Lovenox in the emergency room. Last echocardiogram a few years ago showed normal ejection fraction.  Review of Systems: As above otherwise negative  Past Medical History  Diagnosis Date  . Atrial fibrillation, transient 02/19/2013  . Benign hypertension 02/19/2013  . Diastolic dysfunction 02/19/2013  . DM type 2 (diabetes mellitus, type 2) 02/19/2013  . Prostate cancer 02/19/2013  . Right leg DVT 02/19/2013  . Saddle pulmonary embolus 02/19/2013    Social History: Lives at [facility] independent living with his wife. Previous history of smoking. Denies heavy alcohol use. CODE STATUS is DO NOT RESUSCITATE  Allergies  Allergen Reactions  . Other     Flu  shot causes Anaphylaxis  . Iodine Rash   Family history: Father died of a stroke. Several siblings and his mother had cancer.  Prior to Admission medications   Medication Sig Start Date End Date Taking? Authorizing Provider  aspirin EC 81 MG tablet Take 81 mg by mouth daily.   Yes Historical Provider, MD  finasteride (PROSCAR) 5 MG tablet Take 5 mg by mouth daily.   Yes Historical Provider, MD  furosemide (LASIX) 40 MG tablet Take 40 mg by mouth daily.   Yes Historical Provider, MD  glipiZIDE (GLUCOTROL) 5 MG tablet Take 5 mg by mouth 2 (two) times daily before a meal.   Yes Historical Provider, MD  Glucosamine Sulfate 500 MG TABS Take 1 tablet by mouth daily.   Yes Historical Provider, MD  lisinopril (PRINIVIL,ZESTRIL) 10 MG tablet Take 10 mg by mouth daily.   Yes Historical Provider, MD  omeprazole (PRILOSEC) 20 MG capsule Take 20 mg by mouth daily.   Yes Historical Provider, MD   Physical Exam: Filed Vitals:   02/19/13 1500 02/19/13 1545 02/19/13 1600 02/19/13 1700  BP: 133/63 105/46 127/61 117/66  Pulse: 81 40 81 88  Temp:    97.9 F (36.6 C)  TempSrc:    Oral  Resp: 19 17 26 17   Weight:      SpO2: 93% 91% 96% 93%   BP 117/66  Pulse 88  Temp(Src) 97.9 F (36.6 C) (Oral)  Resp 17  Wt 75.932 kg (167 lb 6.4 oz)  SpO2 93%  General Appearance:    asleep. Arousable. Breathing  nonlabored. Appears quite comfortable.   Head:    Normocephalic, without obvious abnormality, atraumatic  Eyes:    PERRL, conjunctiva/corneas clear, EOM's intact, fundi    benign, both eyes       Ears:    bilateral hearing aids in place   Nose:   Nares normal, septum midline, mucosa normal, no drainage   or sinus tenderness  Throat:   Lips, mucosa, and tongue normal; teeth and gums normal  Neck:   Supple, symmetrical, trachea midline, no adenopathy;       thyroid:  No enlargement/tenderness/nodules; no carotid   bruit or JVD  Back:     Symmetric, no curvature, ROM normal, no CVA tenderness  Lungs:      Clear to auscultation bilaterally, respirations unlabored  Chest wall:    No tenderness or deformity  Heart:    Regular rate and rhythm, S1 and S2 normal, no murmur, rub   or gallop  Abdomen:     Soft, non-tender, bowel sounds active all four quadrants,    no masses, no organomegaly  Genitalia:   deferred   Rectal:   deferred   Extremities:   mild right greater than left edema mainly at the ankle. No calf tenderness. Homans sign negative.   Pulses:   2+ and symmetric all extremities  Skin:   Skin color, texture, turgor normal, no rashes or lesions  Lymph nodes:   Cervical, supraclavicular, and axillary nodes normal  Neurologic:   CNII-XII intact. Normal strength, sensation and reflexes      throughout    psychiatric: Normal affect. Calm and cooperative.   Labs on Admission:  Basic Metabolic Panel:  Recent Labs Lab 02/19/13 1120  NA 141  K 3.8  CL 103  GLUCOSE 126*  BUN 15  CREATININE 1.00   Liver Function Tests: No results found for this basename: AST, ALT, ALKPHOS, BILITOT, PROT, ALBUMIN,  in the last 168 hours No results found for this basename: LIPASE, AMYLASE,  in the last 168 hours No results found for this basename: AMMONIA,  in the last 168 hours CBC:  Recent Labs Lab 02/19/13 1106 02/19/13 1120  WBC 8.1  --   NEUTROABS 5.7  --   HGB 13.7 13.9  HCT 40.8 41.0  MCV 89.9  --   PLT 169  --    Cardiac Enzymes:  Recent Labs Lab 02/19/13 1106  TROPONINI <0.30    BNP (last 3 results) No results found for this basename: PROBNP,  in the last 8760 hours CBG:  Recent Labs Lab 02/19/13 1212 02/19/13 1745  GLUCAP 106* 149*    Radiological Exams on Admission: Ct Angio Chest Pe W/cm &/or Wo Cm  02/19/2013   *RADIOLOGY REPORT*  Clinical Data: Syncope.  New onset atrial fibrillation. Hypertension.  CT ANGIOGRAPHY CHEST  Technique:  Multidetector CT imaging of the chest using the standard protocol during bolus administration of intravenous contrast. Multiplanar  reconstructed images including MIPs were obtained and reviewed to evaluate the vascular anatomy.  Contrast: OMNIPAQUE IOHEXOL 350 MG/ML SOLN  Comparison: 02/19/2013 radiographs  Findings: Acute saddle embolus noted, extending across the right and left pulmonary arteries and into the left upper lobe and bilateral lower lobe pulmonary arteries.  Most of the thrombus is central in location although smaller more peripheral emboli are visible.  Interventricular septum flattened.  No obvious filling defect in the chambers of the heart.  Incidental azygos fissure. Punctate calcifications in the liver and spleen noted.  Small type 1 hiatal  hernia.  There is interstitial accentuation, particularly at the lung bases. Mild lingular subsegmental atelectasis.  Very faint mosaic attenuation in the lungs.  Thoracic spondylosis observed.  There is some atherosclerotic calcification in the splenic artery.  IMPRESSION:  1.  Large acute saddle embolus extending into the right and left pulmonary arteries and branches.  Clot burden is large.  Flattening of the interventricular septum may reflect elevated right heart pressures.  There is likely some secondary mosaic attenuation and interstitial accentuation. 2.  Old granulomatous disease. 3.  Small hiatal hernia.  I discussed the finding of large acute saddle embolus with Dr. Eber Hong at 3:01 p.m. on 02/19/2013 by telephone.   Original Report Authenticated By: Gaylyn Rong, M.D.   Dg Chest Port 1 View  02/19/2013   *RADIOLOGY REPORT*  Clinical Data: Chest pain.  PORTABLE CHEST - 1 VIEW  Comparison: 03/15/2010  Findings: Left base scarring or atelectasis, improved since prior study.  Right lung is clear.  Heart is upper limits normal in size. No effusions or acute bony abnormality.  IMPRESSION: Left base scarring or atelectasis.   Original Report Authenticated By: Charlett Nose, M.D.    EKG: ATRIAL FIBRILLATION ~ ? Atrial activity PROBABLE RIGHT VENTRICULAR HYPERTROPHY  ~ prominent R or R' w/ RAD or RAA LATERAL INFARCT, AGE INDETERMINATE ~ Q>41mS, T neg, I aVL V5 V6  Assessment/Plan Principal Problem:   Saddle pulmonary embolus with RV strain:  HD stable, but will monitor in SDU due to clot burden.  Continue lovenox.  Start coumadin.  Etiology likely prostate cancer.  Will not get echo at this point, as it will not change management.  May reconsider if a fib returns and remains persistent. Active Problems:   Atrial fibrillation, transient due to above   Right leg DVT   DM type 2 (diabetes mellitus, type 2)   Prostate cancer   Benign hypertension: hold meds for now   Diastolic dysfunction, compensated  Code Status: DNR Family Communication: wife Disposition Plan: home  Time spent: 60 minutes  Christiane Ha [facility] Pager [phone]  If 7PM-7AM, please contact night-coverage [email or web address] Password [facility] 02/19/2013, 6:11 PM

## 2013-02-20 DIAGNOSIS — I2699 Other pulmonary embolism without acute cor pulmonale: Secondary | ICD-10-CM

## 2013-02-20 LAB — GLUCOSE, CAPILLARY
Glucose-Capillary: 141 mg/dL — ABNORMAL HIGH (ref 70–99)
Glucose-Capillary: 173 mg/dL — ABNORMAL HIGH (ref 70–99)
Glucose-Capillary: 112 mg/dL — ABNORMAL HIGH (ref 70–99)

## 2013-02-20 LAB — CBC
MCV: 89.2 fL (ref 78.0–100.0)
Platelets: 169 10*3/uL (ref 150–400)
RBC: 4.45 MIL/uL (ref 4.22–5.81)
WBC: 13.4 10*3/uL — ABNORMAL HIGH (ref 4.0–10.5)

## 2013-02-20 LAB — PROTIME-INR: Prothrombin Time: 14.4 seconds (ref 11.6–15.2)

## 2013-02-20 MED ORDER — WARFARIN SODIUM 2.5 MG PO TABS
2.5000 mg | ORAL_TABLET | Freq: Once | ORAL | Status: AC
  Administered 2013-02-20: 2.5 mg via ORAL
  Filled 2013-02-20 (×2): qty 1

## 2013-02-20 MED ORDER — ONDANSETRON HCL 4 MG/2ML IJ SOLN: 4.0000 mg | Freq: Four times a day (QID) | INTRAMUSCULAR | Status: AC | PRN

## 2013-02-20 NOTE — Progress Notes (Signed)
Physical Therapy Evaluation Patient Details Name: Matthew Velez MRN: [id number] DOB: 1919/04/30 Today's Date: 02/20/2013 Time: 4098-1191 PT Time Calculation (min): 22 min  PT Assessment / Plan / Recommendation Clinical Impression  Pt s/p PE, afib and right LE DVT with decr mobility secondary to decr endruance for activity.  Will benefit from PT to address endruance and balance.  Pt states balance impaired some PTA.  Pt reports he can borrow a RW if needed from facility and that they will cater his exercises to his needs.  Discuessed some standing exercises pt can do for increasion hip strength as PT noted that hips are the weakest for pt.  No f/u recommended as pt states the "folks will give me exercise".       PT Assessment  Patient needs continued PT services    Follow Up Recommendations  No PT follow up                Equipment Recommendations  Other (comment) (may borrow one from facility)         Frequency Min 3X/week    Precautions / Restrictions Precautions Precautions: Fall Restrictions Weight Bearing Restrictions: No   Pertinent Vitals/Pain VSS, No pain      Mobility  Bed Mobility Bed Mobility: Not assessed Transfers Transfers: Sit to Stand;Stand to Sit Sit to Stand: 4: Min guard;With upper extremity assist;With armrests;From chair/3-in-1 Stand to Sit: 4: Min guard;With upper extremity assist;With armrests;To chair/3-in-1 Details for Transfer Assistance: Pt can stand and get his balance using armrests.   Ambulation/Gait Ambulation/Gait Assistance: 4: Min assist Ambulation Distance (Feet): 175 Feet Assistive device: 1 person hand held assist Ambulation/Gait Assistance Details: Pt needed HHA of 1 on his right UE for steadying assit.  Ususally uses cane.  Pt reports he feels close to baseline but is more unsteady just from being in bed a few days.   Gait Pattern: Step-through pattern;Decreased stride length;Decreased step length - right;Decreased step length -  left;Decreased hip/knee flexion - right;Decreased hip/knee flexion - left;Shuffle;Decreased trunk rotation;Trunk flexed;Wide base of support Gait velocity: decreased Stairs: No Wheelchair Mobility Wheelchair Mobility: No    Exercises General Exercises - Lower Extremity Ankle Circles/Pumps: AROM;Both;10 reps;Seated Long Arc Quad: AROM;Both;10 reps;Seated Hip Flexion/Marching: AROM;10 reps;Seated   PT Diagnosis: Generalized weakness  PT Problem List: Decreased strength;Decreased activity tolerance;Decreased balance;Decreased mobility;Decreased knowledge of use of DME PT Treatment Interventions: DME instruction;Gait training;Functional mobility training;Therapeutic activities;Therapeutic exercise;Balance training;Patient/family education   PT Goals Acute Rehab PT Goals PT Goal Formulation: With patient Time For Goal Achievement: 03/06/13 Potential to Achieve Goals: Good Pt will go Supine/Side to Sit: Independently PT Goal: Supine/Side to Sit - Progress: Goal set today Pt will go Sit to Stand: Independently PT Goal: Sit to Stand - Progress: Goal set today Pt will go Stand to Sit: Independently PT Goal: Stand to Sit - Progress: Goal set today Pt will Ambulate: 51 - 150 feet;with modified independence;with least restrictive assistive device PT Goal: Ambulate - Progress: Goal set today Pt will Perform Home Exercise Program: Independently PT Goal: Perform Home Exercise Program - Progress: Goal set today Additional Goals Additional Goal #1: Pt to score 46/56 on Berg. PT Goal: Additional Goal #1 - Progress: Goal set today  Visit Information  Last PT Received On: 02/20/13 Assistance Needed: +1    Subjective Data  Subjective: "I would like to try and go for it." Patient Stated Goal: to go home   Prior Functioning  Home Living Lives With: Spouse Available Help at Discharge: Family;Available  24 hours/day Type of Home: Independent living facility [facility] Independent living) Home  Access: Level entry Home Layout: One level Bathroom Shower/Tub: Health visitor: Standard Home Adaptive Equipment: Grab bars around toilet;Grab bars in shower;Straight cane (Has access to any equipment he needs) Additional Comments: Meals provided.  Pt and wife exercise 2 x day at least 30 min each time.   Prior Function Level of Independence: Independent with assistive device(s) Able to Take Stairs?: No Vocation: Retired Musician: No difficulties    Copywriter, advertising Arousal/Alertness: Awake/alert Behavior During Therapy: WFL for tasks assessed/performed Overall Cognitive Status: Within Functional Limits for tasks assessed    Extremity/Trunk Assessment Right Lower Extremity Assessment RLE ROM/Strength/Tone: [facility] for tasks assessed Left Lower Extremity Assessment LLE ROM/Strength/Tone: WFL for tasks assessed Trunk Assessment Trunk Assessment: Kyphotic   Balance Static Standing Balance Static Standing - Balance Support: Right upper extremity supported;During functional activity Static Standing - Level of Assistance: 4: Min assist  End of Session PT - End of Session Equipment Utilized During Treatment: Gait belt Activity Tolerance: Patient tolerated treatment well Patient left: in chair;with call bell/phone within reach Nurse Communication: Mobility status       INGOLD,Shalaunda Weatherholtz 02/20/2013, 4:30 PM [facility] [phone] (pager)

## 2013-02-20 NOTE — Care Management Note (Addendum)
    Page 1 of 1   02/25/2013     4:18:43 PM   CARE MANAGEMENT NOTE 02/25/2013  Patient:  Matthew Velez, Matthew Velez   Account Number:  [id number]  Date Initiated:  02/20/2013  Documentation initiated by:  Junius Creamer  Subjective/Objective Assessment:   adm w pul embolus     Action/Plan:   from [facility] wife, pcp dr Ann Maki [place]   Anticipated DC Date:  02/22/2013   Anticipated DC Plan:  HOME W HOME HEALTH SERVICES  In-house referral  Clinical Social Worker      DC Planning Services  CM consult  Medication Assistance      Choice offered to / List presented to:             Status of service:  Completed, signed off Medicare Important Message given?   (If response is "NO", the following Medicare IM given date fields will be blank) Date Medicare IM given:   Date Additional Medicare IM given:    Discharge Disposition:  HOME/SELF CARE  Per UR Regulation:  Reviewed for med. necessity/level of care/duration of stay  If discussed at Long Length of Stay Meetings, dates discussed:    Comments:  02/25/13 Ilaisaane Marts,RN,BSN [phone] REITERATED INSURANCE INFORMATION REGARDING XARELTO WITH PT AND WIFE.  THEY UNDERSTAND COPAY INFO, AND STATE THEY CAN AFFORD THIS.  5/30 1033a debbie dowell rn,bsn getting cm sec to ck for copay for xarelto.per cm sec has 45.00 copay for xarelto.pt and wife in indep living at [facility]. aleted them to 45.00 per month copay for xarelto and gave them 10day free card. they will talk w md and decide on coumadin vs xarelto. at [facility] they can get labs drawn easily.

## 2013-02-20 NOTE — Progress Notes (Signed)
Pt moved to unit 2000, room 2040.  Report called to Lonia Blood, RN.  Pt transported without s/s of distress by nurse tech. Wife at bedside during transfer.  Will continue to monitor.

## 2013-02-20 NOTE — Progress Notes (Signed)
[facility] Progress Note [place] TEAM 1 - Stepdown/ICU TEAM   Matthew Velez WJX:[id number] DOB: 06-06-1919 DOA: 02/19/2013 PCP: Ginette Otto, MD  Brief narrative: 79 year old male patient with recent diagnosis of prostate cancer. Was sent to the emergency department because he was found to be in atrial fibrillation at his physician's office. While in the emergency department he converted to sinus rhythm. In regards to recent history the wife had noted about one week prior the patient having lower extremity edema. The patient apparently had some type of syncopal episode with loss of consciousness for at least 30-60 seconds 24 hours prior to presentation. The wife did call EMS but the patient refused transport to the emergency department. A visiting nurse practitioner came by to evaluate the patient and noted the patient had lower extremity cellulitic type changes in the right leg several for the patient to the doctor's office. The doctor's office it was concerning that patient may have a DVT. Doppler studies performed in the emergency department did reveal DVT and CT angiogram of the chest revealed saddle pulmonary emboli. His EKG was also concerning for possible right heart strain. Patient had no awareness of any tachycardia palpitations.  Assessment/Plan:    Saddle pulmonary embolus -has been weaned rapidly off oxygen -on full dose Lovenox -currently on Coumadin- co pay for Xarelto is $45- pt and wife need to decide which is preferred med -suspect underlying malignancy precipitating factor -known moderate pulmonary HTN per ECHO 2009    Atrial fibrillation, transient -likely due to PE since resolved spontaneously -no additional work up indicated presently - follow on tele     Right leg DVT/edema -anti coagulation    DM type 2 (diabetes mellitus, type 2) -CBG controlled -continue Glucotrol/SSI    Prostate cancer -documented non aggressive approach to treatment -cont  Proscar    Benign hypertension -BP soft -ACE I on hold - watch off lasix    Diastolic dysfunction/age related vs HTN -per ECHO 2009 -preserved LV function so unclear why on regular Lasix  DVT prophylaxis: Full dose Lovenox Code Status: DO NOT RESUSCITATE Family Communication: Patient Disposition Plan: Transfer to telemetry Isolation: None  Consultants: None  Procedures: Lower extremity venous duplex/right Findings consistent with acute deep vein thrombosis involving the popliteal and posterior tibial veins of the right lower extremity.  Antibiotics: None  HPI/Subjective: Awake and alert without any complaints. Denies any tachycardia palpitations, shortness of breath or chest pain. Denies difficulty breathing. Prior to obtaining information regarding co-pay for Xarelto patient endorsed approval of Xarelto over Coumadin given desire to avoid frequent laboratory testing.  Objective: Blood pressure 112/41, pulse 42, temperature 97.4 F (36.3 C), temperature source Oral, resp. rate 20, height 5\' 5"  (1.651 m), weight 76.9 kg (169 lb 8.5 oz), SpO2 96.00%.  Intake/Output Summary (Last 24 hours) at 02/20/13 1402 Last data filed at 02/20/13 0900  Gross per 24 hour  Intake    600 ml  Output   1150 ml  Net   -550 ml    Exam: General: No acute respiratory distress Lungs: Clear to auscultation bilaterally without wheezes or crackles, RA Cardiovascular: Regular rate and rhythm without murmur gallop or rub normal S1 and S2, 1+ peripheral edema with mild erythema in a circumferential pattern just above the ankle on right lower extremity, no JVD Abdomen: Nontender, nondistended, soft, bowel sounds positive, no rebound, no ascites, no appreciable mass Musculoskeletal: No significant cyanosis, clubbing of bilateral lower extremities Neurological: Alert and oriented x 3, moves all extremities x  4 without focal neurological deficits, CN 2-12 intact  Scheduled Meds: Scheduled Meds: .  enoxaparin (LOVENOX) injection  1 mg/kg Subcutaneous Q12H  . finasteride  5 mg Oral Daily  . furosemide  40 mg Oral Daily  . glipiZIDE  5 mg Oral BID AC  . pantoprazole  40 mg Oral Daily  . sodium chloride  3 mL Intravenous Q12H  . sodium chloride  3 mL Intravenous Q12H  . warfarin  2.5 mg Oral ONCE-1800  . Warfarin - Pharmacist Dosing Inpatient   Does not apply q1800   Data Reviewed: Basic Metabolic Panel:  Recent Labs Lab 02/19/13 1120  NA 141  K 3.8  CL 103  GLUCOSE 126*  BUN 15  CREATININE 1.00   CBC:  Recent Labs Lab 02/19/13 1106 02/19/13 1120 02/20/13 0400  WBC 8.1  --  13.4*  NEUTROABS 5.7  --   --   HGB 13.7 13.9 13.4  HCT 40.8 41.0 39.7  MCV 89.9  --  89.2  PLT 169  --  169   Cardiac Enzymes:  Recent Labs Lab 02/19/13 1106  TROPONINI <0.30   CBG:  Recent Labs Lab 02/19/13 1212 02/19/13 1745 02/20/13 0829 02/20/13 1156  GLUCAP 106* 149* 141* 173*    Recent Results (from the past 240 hour(s))  MRSA PCR SCREENING     Status: None   Collection Time    02/19/13  4:37 PM      Result Value Range Status   MRSA by PCR NEGATIVE  NEGATIVE Final   Comment:            The GeneXpert MRSA Assay (FDA     approved for NASAL specimens     only), is one component of a     comprehensive MRSA colonization     surveillance program. It is not     intended to diagnose MRSA     infection nor to guide or     monitor treatment for     MRSA infections.     Studies:  Recent x-ray studies have been reviewed in detail by the Attending Physician  Scheduled Meds:  Reviewed in detail by the Attending Physician   Junious Silk, ANP [facility] Office  [phone] Pager [phone]  On-Call/Text Page:      [email or web address]      password [email or web address]  If 7PM-7AM, please contact night-coverage [email or web address] Password [email or web address] 02/20/2013, 2:02 PM   LOS: 1 day   I have personally examined this patient and reviewed the entire database. I have reviewed the above  note, made any necessary editorial changes, and agree with its content.  Lonia Blood, MD [facility]

## 2013-02-20 NOTE — Progress Notes (Signed)
ANTICOAGULATION CONSULT NOTE - Follow Up Consult  Pharmacy Consult for Lovenox>>warfarin  Indication: Saddle embolus, acute DVT of the right lower extremity, new onset atrial fibrillation  Patient Measurements: Height: 5\' 5"  (165.1 cm) Weight: 169 lb 8.5 oz (76.9 kg) IBW/kg (Calculated) : 61.5   Vital Signs: Temp: 97.3 F (36.3 C) (05/30 0825) Temp src: Oral (05/30 0825) BP: 122/48 mmHg (05/30 0825) Pulse Rate: 81 (05/30 0825)  Labs:  Recent Labs  02/19/13 1106 02/19/13 1120 02/20/13 0400  HGB 13.7 13.9 13.4  HCT 40.8 41.0 39.7  PLT 169  --  169  APTT 31  --   --   LABPROT 13.8  --  14.4  INR 1.07  --  1.14  CREATININE  --  1.00  --   TROPONINI <0.30  --   --     Estimated Creatinine Clearance: 46.1 ml/min (by C-G formula based on Cr of 1).  Assessment: 79 y.o.male with an acute saddle embolus, RLE DVT, and new onset of atrial fibrillation who is to be placed on Coumadin with Lovenox bridging. Slight trend up in INR this am to 1.14. CBC stable, no bleeding noted overnight.  Goal of Therapy:  INR 2-3 Anti-Xa level 0.6-1.2 units/ml 4hrs after LMWH dose given Monitor platelets by anticoagulation protocol: Yes   Plan:  Continue Lovenox 75mg  q12 hours -adjust times to 10a/10p CBC every 72h per protocol Repeat warfarin 2.5mg  tonight Daily INR  Sheppard Coil PharmD., BCPS Clinical Pharmacist Pager [phone] 02/20/2013 9:06 AM

## 2013-02-21 DIAGNOSIS — I10 Essential (primary) hypertension: Secondary | ICD-10-CM

## 2013-02-21 LAB — GLUCOSE, CAPILLARY

## 2013-02-21 LAB — CBC
HCT: 39 % (ref 39.0–52.0)
MCHC: 33.1 g/dL (ref 30.0–36.0)
MCV: 89 fL (ref 78.0–100.0)
RDW: 14.3 % (ref 11.5–15.5)

## 2013-02-21 MED ORDER — WARFARIN SODIUM 4 MG PO TABS
4.0000 mg | ORAL_TABLET | Freq: Once | ORAL | Status: AC
  Administered 2013-02-21: 4 mg via ORAL
  Filled 2013-02-21: qty 1

## 2013-02-21 NOTE — Progress Notes (Signed)
ANTICOAGULATION CONSULT NOTE - Follow Up Consult  Pharmacy Consult:  Lovenox / Coumadin (Overlap D#3/5) Indication: Saddle embolus, acute DVT of the right lower extremity, new onset atrial fibrillation  Patient Measurements: Height: 5\' 5"  (165.1 cm) Weight: 169 lb 8.5 oz (76.9 kg) IBW/kg (Calculated) : 61.5   Vital Signs: Temp: 97.9 F (36.6 C) (05/31 0505) Temp src: Oral (05/31 0505) BP: 109/57 mmHg (05/31 0505) Pulse Rate: 78 (05/31 0505)  Labs:  Recent Labs  02/19/13 1106 02/19/13 1120 02/20/13 0400 02/21/13 0455  HGB 13.7 13.9 13.4 12.9*  HCT 40.8 41.0 39.7 39.0  PLT 169  --  169 154  APTT 31  --   --   --   LABPROT 13.8  --  14.4 13.8  INR 1.07  --  1.14 1.07  CREATININE  --  1.00  --   --   TROPONINI <0.30  --   --   --     Estimated Creatinine Clearance: 46.1 ml/min (by C-G formula based on Cr of 1).     Assessment: Matthew Velez with an acute saddle embolus, RLE DVT, and new onset of atrial fibrillation started on Coumadin with Lovenox bridging.  INR remains unchanged post Coumadin 2.5mg  PO x 2 doses.  Her renal function is stable.  No bleeding reported.   Goal of Therapy:  INR 2-3 Anti-Xa level 0.6-1.2 units/ml 4hrs after LMWH dose given Monitor platelets by anticoagulation protocol: Yes    Plan:  - Coumadin 4mg  PO today - Lovenox 75mg  Q12H - CBC Q72H while on Lovenox - Daily PT / INR     Kurt Azimi D. Laney Potash, PharmD, BCPS Pager:  [phone] 02/21/2013, 11:42 AM

## 2013-02-21 NOTE — Progress Notes (Addendum)
[facility] Progress Note [place] TEAM 1 - Stepdown/ICU TEAM   Matthew Velez [id number]:[id number] DOB: 09-20-1919 DOA: 02/19/2013 PCP: Ginette Otto, MD  Brief narrative: 79 year old male patient with recent diagnosis of prostate cancer. Was sent to the emergency department because he was found to be in atrial fibrillation at his physician's office. While in the emergency department he converted to sinus rhythm. In regards to recent history the wife had noted about one week prior the patient having lower extremity edema. The patient apparently had some type of syncopal episode with loss of consciousness for at least 30-60 seconds 24 hours prior to presentation. The wife did call EMS but the patient refused transport to the emergency department. A visiting nurse practitioner came by to evaluate the patient and noted the patient had lower extremity cellulitic type changes in the right leg several for the patient to the doctor's office. The doctor's office it was concerning that patient may have a DVT. Doppler studies performed in the emergency department did reveal DVT and CT angiogram of the chest revealed saddle pulmonary emboli. His EKG was also concerning for possible right heart strain. Patient had no awareness of any tachycardia palpitations.  Assessment/Plan:    Saddle pulmonary embolus -has been weaned rapidly off oxygen -on full dose Lovenox -currently on Coumadin- co pay for Xarelto is $45- pt and wife need to decide which is preferred med. He reports he hasnot made up his mind yet. .  -suspect underlying malignancy precipitating factor -known moderate pulmonary HTN per ECHO 2009    Atrial fibrillation, transient -likely due to PE since resolved spontaneously -no additional work up indicated presently - follow on tele     Right leg DVT/edema -anti coagulation    DM type 2 (diabetes mellitus, type 2) -CBG controlled -continue Glucotrol/SSI - CBG (last 3)   Recent  Labs  02/20/13 2115 02/21/13 0551 02/21/13 1117  GLUCAP 95 97 88        Prostate cancer -documented non aggressive approach to treatment -cont Proscar    Benign hypertension -BP soft -ACE I on hold - watch off lasix    Diastolic dysfunction/age related vs HTN -per ECHO 2009 -preserved LV function so unclear why on regular Lasix  DVT prophylaxis: Full dose Lovenox Code Status: DO NOT RESUSCITATE Family Communication: Patient Disposition Plan: Transfer to telemetry Isolation: None  Consultants: None  Procedures: Lower extremity venous duplex/right Findings consistent with acute deep vein thrombosis involving the popliteal and posterior tibial veins of the right lower extremity.  Antibiotics: None  HPI/Subjective: Awake and alert without any complaints. Pleasant and happy that he will be discharged soon.  Objective: Blood pressure 109/57, pulse 78, temperature 97.9 F (36.6 C), temperature source Oral, resp. rate 19, height 5\' 5"  (1.651 m), weight 76.9 kg (169 lb 8.5 oz), SpO2 95.00%.  Intake/Output Summary (Last 24 hours) at 02/21/13 1242 Last data filed at 02/21/13 0506  Gross per 24 hour  Intake    480 ml  Output    800 ml  Net   -320 ml    Exam: General: No acute respiratory distress Lungs: Clear to auscultation bilaterally without wheezes or crackles, RA Cardiovascular: Regular rate and rhythm without murmur gallop or rub normal S1 and S2, 1+ peripheral edema with mild erythema in a circumferential pattern just above the ankle on right lower extremity, no JVD Abdomen: Nontender, nondistended, soft, bowel sounds positive, no rebound, no ascites, no appreciable mass Musculoskeletal: No significant cyanosis, clubbing of bilateral lower extremities  Neurological: Alert and oriented x 3, moves all extremities x 4 without focal neurological deficits, CN 2-12 intact  Scheduled Meds: Scheduled Meds: . enoxaparin (LOVENOX) injection  1 mg/kg Subcutaneous Q12H   . finasteride  5 mg Oral Daily  . glipiZIDE  5 mg Oral BID AC  . pantoprazole  40 mg Oral Daily  . warfarin  4 mg Oral ONCE-1800  . Warfarin - Pharmacist Dosing Inpatient   Does not apply q1800   Data Reviewed: Basic Metabolic Panel:  Recent Labs Lab 02/19/13 1120  NA 141  K 3.8  CL 103  GLUCOSE 126*  BUN 15  CREATININE 1.00   CBC:  Recent Labs Lab 02/19/13 1106 02/19/13 1120 02/20/13 0400 02/21/13 0455  WBC 8.1  --  13.4* 9.8  NEUTROABS 5.7  --   --   --   HGB 13.7 13.9 13.4 12.9*  HCT 40.8 41.0 39.7 39.0  MCV 89.9  --  89.2 89.0  PLT 169  --  169 154   Cardiac Enzymes:  Recent Labs Lab 02/19/13 1106  TROPONINI <0.30   CBG:  Recent Labs Lab 02/20/13 1156 02/20/13 1637 02/20/13 2115 02/21/13 0551 02/21/13 1117  GLUCAP 173* 112* 95 97 88    Recent Results (from the past 240 hour(s))  MRSA PCR SCREENING     Status: None   Collection Time    02/19/13  4:37 PM      Result Value Range Status   MRSA by PCR NEGATIVE  NEGATIVE Final   Comment:            The GeneXpert MRSA Assay (FDA     approved for NASAL specimens     only), is one component of a     comprehensive MRSA colonization     surveillance program. It is not     intended to diagnose MRSA     infection nor to guide or     monitor treatment for     MRSA infections.     Kathlen Mody, MD  [phone]

## 2013-02-21 NOTE — Progress Notes (Signed)
Pt with burst of SVT, denies complaints.  NAD, VSS, will continue to monitor.

## 2013-02-22 LAB — CBC
MCH: 30.3 pg (ref 26.0–34.0)
MCV: 89.9 fL (ref 78.0–100.0)
Platelets: 174 10*3/uL (ref 150–400)
RBC: 4.35 MIL/uL (ref 4.22–5.81)
RDW: 14.4 % (ref 11.5–15.5)

## 2013-02-22 LAB — GLUCOSE, CAPILLARY
Glucose-Capillary: 94 mg/dL (ref 70–99)
Glucose-Capillary: 128 mg/dL — ABNORMAL HIGH (ref 70–99)

## 2013-02-22 MED ORDER — WARFARIN SODIUM 4 MG PO TABS
4.0000 mg | ORAL_TABLET | Freq: Once | ORAL | Status: AC
  Administered 2013-02-22: 4 mg via ORAL
  Filled 2013-02-22: qty 1

## 2013-02-22 NOTE — Progress Notes (Signed)
[facility] Progress Note [place] TEAM 1 - Stepdown/ICU TEAM   Matthew Velez WUJ:[id number] DOB: 1919-06-22 DOA: 02/19/2013 PCP: Ginette Otto, MD  Brief narrative: 79 year old male patient with recent diagnosis of prostate cancer. Was sent to the emergency department because he was found to be in atrial fibrillation at his physician's office. While in the emergency department he converted to sinus rhythm. In regards to recent history the wife had noted about one week prior the patient having lower extremity edema. The patient apparently had some type of syncopal episode with loss of consciousness for at least 30-60 seconds 24 hours prior to presentation. The wife did call EMS but the patient refused transport to the emergency department. A visiting nurse practitioner came by to evaluate the patient and noted the patient had lower extremity cellulitic type changes in the right leg several for the patient to the doctor's office. The doctor's office it was concerning that patient may have a DVT. Doppler studies performed in the emergency department did reveal DVT and CT angiogram of the chest revealed saddle pulmonary emboli. His EKG was also concerning for possible right heart strain. Patient had no awareness of any tachycardia palpitations.  Assessment/Plan:    Saddle pulmonary embolus -has been weaned rapidly off oxygen -on full dose Lovenox -currently on Coumadin- co pay for Xarelto is $45- pt and wife need to decide which is preferred med. He reports he hasnot made up his mind yet. .  -suspect underlying malignancy precipitating factor -known moderate pulmonary HTN per ECHO 2009 - repeat echo today.     Atrial fibrillation, transient -likely due to PE since resolved spontaneously - follow on tele     Right leg DVT/edema -anti coagulation    DM type 2 (diabetes mellitus, type 2) -CBG controlled -continue Glucotrol/SSI - CBG (last 3)   Recent Labs  02/21/13 2057  02/22/13 0558 02/22/13 1118  GLUCAP 111* 94 128*        Prostate cancer -documented non aggressive approach to treatment -cont Proscar    Benign hypertension -BP soft -ACE I on hold - watch off lasix    Diastolic dysfunction/age related vs HTN -per ECHO 2009 -preserved LV function so unclear why on regular Lasix  DVT prophylaxis: Full dose Lovenox Code Status: DO NOT RESUSCITATE Family Communication: Patient Disposition Plan: Transfer to telemetry Isolation: None  Consultants: None  Procedures: Lower extremity venous duplex/right Findings consistent with acute deep vein thrombosis involving the popliteal and posterior tibial veins of the right lower extremity.  Antibiotics: None  HPI/Subjective: Awake and alert without any complaints. Pleasant and happy that he will be discharged soon.  Objective: Blood pressure 115/53, pulse 78, temperature 97.4 F (36.3 C), temperature source Oral, resp. rate 18, height 5\' 5"  (1.651 m), weight 76.9 kg (169 lb 8.5 oz), SpO2 97.00%.  Intake/Output Summary (Last 24 hours) at 02/22/13 1605 Last data filed at 02/22/13 0900  Gross per 24 hour  Intake      0 ml  Output    700 ml  Net   -700 ml    Exam: General: No acute respiratory distress Lungs: Clear to auscultation bilaterally without wheezes or crackles, RA Cardiovascular: Regular rate and rhythm without murmur gallop or rub normal S1 and S2, 1+ peripheral edema with mild erythema in a circumferential pattern just above the ankle on right lower extremity, no JVD Abdomen: Nontender, nondistended, soft, bowel sounds positive, no rebound, no ascites, no appreciable mass Musculoskeletal: No significant cyanosis, clubbing of bilateral lower  extremities Neurological: Alert and oriented x 3, moves all extremities x 4 without focal neurological deficits, CN 2-12 intact  Scheduled Meds: Scheduled Meds: . enoxaparin (LOVENOX) injection  1 mg/kg Subcutaneous Q12H  . finasteride  5  mg Oral Daily  . glipiZIDE  5 mg Oral BID AC  . pantoprazole  40 mg Oral Daily  . warfarin  4 mg Oral ONCE-1800  . Warfarin - Pharmacist Dosing Inpatient   Does not apply q1800   Data Reviewed: Basic Metabolic Panel:  Recent Labs Lab 02/19/13 1120  NA 141  K 3.8  CL 103  GLUCOSE 126*  BUN 15  CREATININE 1.00   CBC:  Recent Labs Lab 02/19/13 1106 02/19/13 1120 02/20/13 0400 02/21/13 0455 02/22/13 0330  WBC 8.1  --  13.4* 9.8 8.1  NEUTROABS 5.7  --   --   --   --   HGB 13.7 13.9 13.4 12.9* 13.2  HCT 40.8 41.0 39.7 39.0 39.1  MCV 89.9  --  89.2 89.0 89.9  PLT 169  --  169 154 174   Cardiac Enzymes:  Recent Labs Lab 02/19/13 1106  TROPONINI <0.30   CBG:  Recent Labs Lab 02/21/13 1117 02/21/13 1613 02/21/13 2057 02/22/13 0558 02/22/13 1118  GLUCAP 88 82 111* 94 128*    Recent Results (from the past 240 hour(s))  MRSA PCR SCREENING     Status: None   Collection Time    02/19/13  4:37 PM      Result Value Range Status   MRSA by PCR NEGATIVE  NEGATIVE Final   Comment:            The GeneXpert MRSA Assay (FDA     approved for NASAL specimens     only), is one component of a     comprehensive MRSA colonization     surveillance program. It is not     intended to diagnose MRSA     infection nor to guide or     monitor treatment for     MRSA infections.    Kathlen Mody, MD [phone]

## 2013-02-22 NOTE — Progress Notes (Signed)
ANTICOAGULATION CONSULT NOTE - Follow Up Consult  Pharmacy Consult:  Lovenox / Coumadin (Overlap D#4/5) Indication: Saddle embolus, acute DVT of the right lower extremity, new onset atrial fibrillation  Labs:  Recent Labs  02/20/13 0400 02/21/13 0455 02/22/13 0330  HGB 13.4 12.9* 13.2  HCT 39.7 39.0 39.1  PLT 169 154 174  LABPROT 14.4 13.8 14.7  INR 1.14 1.07 1.17    Estimated Creatinine Clearance: 46.1 ml/min (by C-G formula based on Cr of 1).  Assessment: 79 y.o.male with an acute saddle embolus, RLE DVT, and new onset of atrial fibrillation started on Coumadin with Lovenox bridging.  INR = 1.17 (up from 1.07)  Her renal function is stable.  No bleeding reported.  Goal of Therapy:  INR 2-3 Anti-Xa level 0.6-1.2 units/ml 4hrs after LMWH dose given Monitor platelets by anticoagulation protocol: Yes   Plan:  - Repeat Coumadin 4mg  PO today - Lovenox 75mg  Q12H - CBC Q72H while on Lovenox - Daily PT / INR  Thank you. Okey Regal, PharmD [phone]  02/22/2013, 11:53 AM

## 2013-02-23 LAB — CBC
HCT: 38.5 % — ABNORMAL LOW (ref 39.0–52.0)
Hemoglobin: 12.9 g/dL — ABNORMAL LOW (ref 13.0–17.0)
MCV: 89.1 fL (ref 78.0–100.0)
RBC: 4.32 MIL/uL (ref 4.22–5.81)
RDW: 14.4 % (ref 11.5–15.5)
WBC: 8.7 10*3/uL (ref 4.0–10.5)

## 2013-02-23 LAB — GLUCOSE, CAPILLARY
Glucose-Capillary: 111 mg/dL — ABNORMAL HIGH (ref 70–99)
Glucose-Capillary: 104 mg/dL — ABNORMAL HIGH (ref 70–99)

## 2013-02-23 MED ORDER — WARFARIN SODIUM 6 MG PO TABS
6.0000 mg | ORAL_TABLET | Freq: Once | ORAL | Status: AC
  Administered 2013-02-23: 6 mg via ORAL
  Filled 2013-02-23: qty 1

## 2013-02-23 NOTE — Progress Notes (Signed)
Physical Therapy Treatment and D/C Patient Details Name: Matthew Velez MRN: [id number] DOB: 1919/05/04 Today's Date: 02/23/2013 Time: 0865-7846 PT Time Calculation (min): 24 min  PT Assessment / Plan / Recommendation Comments on Treatment Session  Pt s/p saddle PE and afib with decr mobility secondary to decr endurance and decr balance.  Scored well on DGI at 22/24.  No further PT needs at this time.  Can d/c to I living and continue his activities there as he exercises 5 x week.  No f/u needed.      Follow Up Recommendations  No PT follow up                 Equipment Recommendations  Other (comment) (may borrow one from facility)             Plan Discharge plan remains appropriate    Precautions / Restrictions Precautions Precautions: None Restrictions Weight Bearing Restrictions: No   Pertinent Vitals/Pain VSS, No pain    Mobility  Bed Mobility Bed Mobility: Not assessed Transfers Transfers: Sit to Stand;Stand to Sit Sit to Stand: With upper extremity assist;With armrests;From chair/3-in-1;7: Independent Stand to Sit: With upper extremity assist;With armrests;To chair/3-in-1;7: Independent Details for Transfer Assistance: Pt can stand and get his balance using armrests.   Ambulation/Gait Ambulation/Gait Assistance: 6: Modified independent (Device/Increase time) Ambulation Distance (Feet): 350 Feet Assistive device: Straight cane Ambulation/Gait Assistance Details: Pt uses his cane for steadying assist.  No LOB with min to mod challenges in hospital environment.  Pt states he is close to baseline status now.   Gait Pattern: Step-through pattern;Decreased stride length;Decreased step length - right;Decreased step length - left;Decreased hip/knee flexion - right;Decreased hip/knee flexion - left;Decreased trunk rotation;Trunk flexed Stairs: No Wheelchair Mobility Wheelchair Mobility: No    Exercises General Exercises - Lower Extremity Ankle Circles/Pumps: AROM;Both;10  reps;Seated Long Arc Quad: AROM;Both;10 reps;Seated Hip Flexion/Marching: AROM;10 reps;Seated   PT Goals Acute Rehab PT Goals PT Goal Formulation: With patient Time For Goal Achievement: 03/06/13 Potential to Achieve Goals: Good Pt will go Supine/Side to Sit: Independently Pt will go Sit to Stand: Independently PT Goal: Sit to Stand - Progress: Met Pt will go Stand to Sit: Independently PT Goal: Stand to Sit - Progress: Met Pt will Ambulate: 51 - 150 feet;with modified independence;with least restrictive assistive device PT Goal: Ambulate - Progress: Met Pt will Perform Home Exercise Program: Independently PT Goal: Perform Home Exercise Program - Progress: Met Additional Goals Additional Goal #1: Pt to score 46/56 on [place]. PT Goal: Additional Goal #1 - Progress: Discontinued (comment)  Visit Information  Last PT Received On: 02/23/13 Assistance Needed: +1    Subjective Data  Subjective: "I am back to doing what I did before." Patient Stated Goal: to go home   Cognition  Cognition Arousal/Alertness: Awake/alert Behavior During Therapy: WFL for tasks assessed/performed Overall Cognitive Status: Within Functional Limits for tasks assessed    Balance  Static Standing Balance Static Standing - Balance Support: Right upper extremity supported;During functional activity Static Standing - Level of Assistance: 6: Modified independent (Device/Increase time) Standardized Balance Assessment Standardized Balance Assessment: Dynamic Gait Index Dynamic Gait Index Level Surface: Normal Change in Gait Speed: Normal Gait with Horizontal Head Turns: Normal Gait with Vertical Head Turns: Normal Gait and Pivot Turn: Normal Step Over Obstacle: Normal Step Around Obstacles: Normal Steps: Moderate Impairment Total Score: 22  End of Session PT - End of Session Equipment Utilized During Treatment: Gait belt Activity Tolerance: Patient tolerated treatment well Patient  left: in chair;with  call bell/phone within reach Nurse Communication: Mobility status       INGOLD,Matthew Velez 02/23/2013, 10:54 AM  Matthew Velez Acute Rehabilitation [phone] (pager)

## 2013-02-23 NOTE — Progress Notes (Signed)
Occupational Therapy Evaluation Patient Details Name: Matthew Velez MRN: [id number] DOB: 1919-07-06 Today's Date: 02/23/2013 Time: 4098-1191 OT Time Calculation (min): 17 min  OT Assessment / Plan / Recommendation Clinical Impression    79 yo withl DVT and saddle pulmonary emboli. A fib. PTA, pt lived in independent living at the [facility] and was independent with ADL and Mod I with mobility @ [place] level. Pt is currently at baseline with ADL and mobility @ [place] level. Educated pt on home safety and recommendations to reduce risk of falls. Pt verbalized understanding. No further OT needed. Pt safe to D/C home. Encourage pt to walk with staff using his cane.    OT Assessment  Patient does not need any further OT services    Follow Up Recommendations  No OT follow up    Barriers to Discharge  none    Equipment Recommendations  None recommended by OT    Recommendations for Other Services  none  Frequency       Precautions / Restrictions Precautions Precautions: Fall Restrictions Weight Bearing Restrictions: No   Pertinent Vitals/Pain no apparent distress     ADL  ADL Comments: Pt mod I with all ADL tasks. Discussed home safety and modifications to reduce risks of falls. REc for pt to use built in shower seat.    OT Diagnosis:    OT Problem List:   OT Treatment Interventions:     OT Goals Acute Rehab OT Goals OT Goal Formulation:  (eval only)  Visit Information  Last OT Received On: 02/23/13 Assistance Needed: +1    Subjective Data      Prior Functioning     Home Living Lives With: Spouse Available Help at Discharge: Family;Available 24 hours/day Type of Home: Independent living facility [facility]) Home Access: Level entry Home Layout: One level Bathroom Shower/Tub: Health visitor: Standard Bathroom Accessibility: Yes How Accessible: Accessible via wheelchair;Accessible via walker Home Adaptive Equipment: Grab bars  around toilet;Grab bars in shower;Straight cane;Built-in shower seat (Has access to any equipment he needs) Additional Comments: Meals provided.  Pt and wife exercise 2 x day at least 30 min each time.   Prior Function Level of Independence: Independent with assistive device(s) Able to Take Stairs?: No Driving: Yes Vocation: Retired Musician: No difficulties         Vision/Perception Vision - History Baseline Vision: Wears glasses all the time   Cognition  Cognition Arousal/Alertness: Awake/alert Behavior During Therapy: WFL for tasks assessed/performed Overall Cognitive Status: Within Functional Limits for tasks assessed    Extremity/Trunk Assessment Right Upper Extremity Assessment RUE ROM/Strength/Tone: [facility] for tasks assessed Left Upper Extremity Assessment LUE ROM/Strength/Tone: WFL for tasks assessed Right Lower Extremity Assessment RLE ROM/Strength/Tone: [facility] for tasks assessed Left Lower Extremity Assessment LLE ROM/Strength/Tone: WFL for tasks assessed Trunk Assessment Trunk Assessment: Kyphotic     Mobility Bed Mobility Bed Mobility: Not assessed Transfers Sit to Stand: With upper extremity assist;From chair/3-in-1;6: Modified independent (Device/Increase time) Stand to Sit: With upper extremity assist;With armrests;To chair/3-in-1;6: Modified independent (Device/Increase time) Details for Transfer Assistance: good safety awareness     Exercise     Balance Static Standing Balance Static Standing - Balance Support: Right upper extremity supported;During functional activity Static Standing - Level of Assistance: 6: Modified independent (Device/Increase time)   End of Session OT - End of Session Equipment Utilized During Treatment: Gait belt Activity Tolerance: Patient tolerated treatment well Patient left: in chair;with call bell/phone within reach Nurse Communication: Mobility  status  GO     Matthew Velez,Matthew Velez 02/23/2013, 4:40 PM [facility], OTR/L  [phone] 02/23/2013 Matthew Velez, OTR/L  [phone] 02/23/2013

## 2013-02-23 NOTE — Progress Notes (Signed)
  Echocardiogram 2D Echocardiogram has been performed.  Cathie Beams 02/23/2013, 2:26 PM

## 2013-02-23 NOTE — Progress Notes (Signed)
[facility] Progress Note    Matthew Velez ZOX:[id number] DOB: [birth year] DOA: 02/19/2013 PCP: Ginette Otto, MD  Brief narrative: 79 year old male patient with recent diagnosis of prostate cancer. Was sent to the emergency department because he was found to be in atrial fibrillation at his physician's office. While in the emergency department he converted to sinus rhythm. In regards to recent history the wife had noted about one week prior the patient having lower extremity edema. The patient apparently had some type of syncopal episode with loss of consciousness for at least 30-60 seconds 24 hours prior to presentation. The wife did call EMS but the patient refused transport to the emergency department. A visiting nurse practitioner came by to evaluate the patient and noted the patient had lower extremity cellulitic type changes in the right leg several for the patient to the doctor's office. The doctor's office it was concerning that patient may have a DVT. Doppler studies performed in the emergency department did reveal DVT and CT angiogram of the chest revealed saddle pulmonary emboli. His EKG was also concerning for possible right heart strain. Patient had no awareness of any tachycardia palpitations.  Assessment/Plan:    Saddle pulmonary embolus -has been weaned rapidly off oxygen -on full dose Lovenox -currently on Coumadin- co pay for Xarelto is $45-  inr subtherapeutic.  -suspect underlying malignancy precipitating factor -known moderate pulmonary HTN per ECHO 2009 - repeat echo is not significant.     Atrial fibrillation, transient -likely due to PE since resolved spontaneously - follow on tele     Right leg DVT/edema -anti coagulation    DM type 2 (diabetes mellitus, type 2) -CBG controlled -continue Glucotrol/SSI - CBG (last 3)   Recent Labs  02/23/13 0550 02/23/13 1113 02/23/13 1623  GLUCAP 111* 104* 116*        Prostate cancer -documented non  aggressive approach to treatment -cont Proscar    Benign hypertension -BP soft -ACE I on hold - watch off lasix    Diastolic dysfunction/age related vs HTN -per ECHO 2009 -preserved LV function so unclear why on regular Lasix  DVT prophylaxis: Full dose Lovenox Code Status: DO NOT RESUSCITATE Family Communication: Patient Disposition Plan: Transfer to telemetry Isolation: None  Consultants: None  Procedures: Lower extremity venous duplex/right Findings consistent with acute deep vein thrombosis involving the popliteal and posterior tibial veins of the right lower extremity.  Antibiotics: None  HPI/Subjective: Awake and alert without any complaints. Pleasant and happy that he will be discharged soon.  Objective: Blood pressure 102/59, pulse 81, temperature 97.8 F (36.6 C), temperature source Oral, resp. rate 18, height 5\' 5"  (1.651 m), weight 76.9 kg (169 lb 8.5 oz), SpO2 96.00%.  Intake/Output Summary (Last 24 hours) at 02/23/13 1725 Last data filed at 02/23/13 1700  Gross per 24 hour  Intake    240 ml  Output   1000 ml  Net   -760 ml    Exam: General: No acute respiratory distress Lungs: Clear to auscultation bilaterally without wheezes or crackles, RA Cardiovascular: Regular rate and rhythm without murmur gallop or rub normal S1 and S2, 1+ peripheral edema with mild erythema in a circumferential pattern just above the ankle on right lower extremity, no JVD Abdomen: Nontender, nondistended, soft, bowel sounds positive, no rebound, no ascites, no appreciable mass Musculoskeletal: No significant cyanosis, clubbing of bilateral lower extremities Neurological: Alert and oriented x 3, moves all extremities x 4 without focal neurological deficits, CN 2-12 intact  Scheduled Meds: Scheduled Meds: .  enoxaparin (LOVENOX) injection  1 mg/kg Subcutaneous Q12H  . finasteride  5 mg Oral Daily  . glipiZIDE  5 mg Oral BID AC  . pantoprazole  40 mg Oral Daily  . Warfarin -  Pharmacist Dosing Inpatient   Does not apply q1800   Data Reviewed: Basic Metabolic Panel:  Recent Labs Lab 02/19/13 1120  NA 141  K 3.8  CL 103  GLUCOSE 126*  BUN 15  CREATININE 1.00   CBC:  Recent Labs Lab 02/19/13 1106 02/19/13 1120 02/20/13 0400 02/21/13 0455 02/22/13 0330 02/23/13 0510  WBC 8.1  --  13.4* 9.8 8.1 8.7  NEUTROABS 5.7  --   --   --   --   --   HGB 13.7 13.9 13.4 12.9* 13.2 12.9*  HCT 40.8 41.0 39.7 39.0 39.1 38.5*  MCV 89.9  --  89.2 89.0 89.9 89.1  PLT 169  --  169 154 174 176   Cardiac Enzymes:  Recent Labs Lab 02/19/13 1106  TROPONINI <0.30   CBG:  Recent Labs Lab 02/22/13 1611 02/22/13 2050 02/23/13 0550 02/23/13 1113 02/23/13 1623  GLUCAP 91 82 111* 104* 116*    Recent Results (from the past 240 hour(s))  MRSA PCR SCREENING     Status: None   Collection Time    02/19/13  4:37 PM      Result Value Range Status   MRSA by PCR NEGATIVE  NEGATIVE Final   Comment:            The GeneXpert MRSA Assay (FDA     approved for NASAL specimens     only), is one component of a     comprehensive MRSA colonization     surveillance program. It is not     intended to diagnose MRSA     infection nor to guide or     monitor treatment for     MRSA infections.    Kathlen Mody, MD [phone]

## 2013-02-23 NOTE — Progress Notes (Signed)
ANTICOAGULATION CONSULT NOTE - Follow Up Consult  Pharmacy Consult for Lovenox and Coumadin Indication: saddle pulmonary embolus, RLL DVT, new onset afib   Allergies  Allergen Reactions  . Other     Flu shot causes Anaphylaxis  . Iodine Rash    Patient Measurements: Height: 5\' 5"  (165.1 cm) Weight: 169 lb 8.5 oz (76.9 kg) IBW/kg (Calculated) : 61.5  Vital Signs: Temp: 97.8 F (36.6 C) (06/02 1326) Temp src: Oral (06/02 1326) BP: 102/59 mmHg (06/02 1326) Pulse Rate: 81 (06/02 1326)  Labs:  Recent Labs  02/21/13 0455 02/22/13 0330 02/23/13 0510  HGB 12.9* 13.2 12.9*  HCT 39.0 39.1 38.5*  PLT 154 174 176  LABPROT 13.8 14.7 16.4*  INR 1.07 1.17 1.35    Estimated Creatinine Clearance: 46.1 ml/min (by C-G formula based on Cr of 1).  Assessment:  Overlap day # 5 Lovenox and Coumadin.  INR increasing very slowly.  Has had Coumadin 2.5 mg x 2 days, then 4 mg x 2 days.  Noted considering Xarelto.  Goal of Therapy:  INR 2-3 Anti-Xa level 0.6-1.2 units/ml 4hrs after LMWH dose given Monitor platelets by anticoagulation protocol: Yes   Plan:   Increase Coumadin to 6 mg x 1 today.  Continue Lovenox 75 mg SQ q12hrs.  Daily PT/INR.  Bmet in am.  CBC at least every 3 days while on Lovenox.  Will follow up Coumadin/Xarelto decision.  Dennie Fetters, [place] Pager: [phone] 02/23/2013,4:22 PM

## 2013-02-23 NOTE — Clinical Documentation Improvement (Signed)
Clinical Documentation Improvement Program Query  THIS DOCUMENT IS NOT A PERMANENT PART OF THE MEDICAL RECORD Please clarify and document in a progress note and/or discharge summary the clinical condition associated with the following supporting information         02/23/13 Dear Provider  In an effort to better capture your patient's severity of illness, reflect appropriate length of stay and utilization of resources, a review of the patient medical record has revealed the following indicators. Please exercise your independent judgment. The fact queries are asked, does not imply that any particular answer is desired or expected.   Possible Conditions:  Acute Cor Pulmonale  Other  Unable to determine  Supporting Information:  Risk Factors: PE causes sudden increase in resistence to blood flow and, most frequently, dilation of the right ventricle. prostate cancer, Diastolic dysfunction   Diagnostics: Large acute saddle embolus extending into the right and left pulmonary arteries and branches. Clot burden is large. Flattening of the interventricular septum may reflect elevated right heart pressures. There is likely some secondary mosaic attenuation and interstitial accentuation.    OZH:YQMVH is upper limits normal in size.  ECG: ATRIAL FIBRILLATION ~ ? Atrial activity PROBABLE RIGHT VENTRICULAR HYPERTROPHY ~ prominent R or           R' w/ RAD or RAA  Signs and Symptoms: syncope, pox: 88%  Treatment: oxygen; monitor and evaluation; aspirin, lovenox, coumadin; lasix  Conditions documented as possible, probable, or suspected can be coded if restated at the time of discharge.   Thank You, Disagreed in documentation  Amada Kingfisher  RN, BSN, CCM Clinical Documentation Specialist: [phone] Health Information Management/[facility]  TO RESPOND TO THE THIS QUERY, FOLLOW THE INSTRUCTIONS BELOW:  1. If needed, update documentation for the patient's encounter  via the notes activity.  2. Access this query again and click edit on the In [employer].  3. After updating, or not, click F2 to complete all highlighted (required) fields concerning your review. Select "additional documentation in the medical record" OR "no additional documentation provided".  4. Click Sign note button.  5. The deficiency will fall out of your In Basket *Please let us know if you are not able to complete this workflow by phone or e-mail (listed below).

## 2013-02-24 LAB — CBC
HCT: 37.7 % — ABNORMAL LOW (ref 39.0–52.0)
Hemoglobin: 12.6 g/dL — ABNORMAL LOW (ref 13.0–17.0)
MCH: 29.9 pg (ref 26.0–34.0)
MCHC: 33.4 g/dL (ref 30.0–36.0)
MCV: 89.3 fL (ref 78.0–100.0)
RBC: 4.22 MIL/uL (ref 4.22–5.81)

## 2013-02-24 LAB — GLUCOSE, CAPILLARY
Glucose-Capillary: 118 mg/dL — ABNORMAL HIGH (ref 70–99)
Glucose-Capillary: 131 mg/dL — ABNORMAL HIGH (ref 70–99)

## 2013-02-24 LAB — BASIC METABOLIC PANEL
BUN: 15 mg/dL (ref 6–23)
CO2: 25 mEq/L (ref 19–32)
Calcium: 8.6 mg/dL (ref 8.4–10.5)
GFR calc non Af Amer: 73 mL/min — ABNORMAL LOW (ref >90)
Glucose, Bld: 101 mg/dL — ABNORMAL HIGH (ref 70–99)

## 2013-02-24 MED ORDER — WARFARIN SODIUM 6 MG PO TABS
6.0000 mg | ORAL_TABLET | Freq: Once | ORAL | Status: AC
  Administered 2013-02-24: 6 mg via ORAL
  Filled 2013-02-24: qty 1

## 2013-02-24 MED ORDER — FUROSEMIDE 40 MG PO TABS
40.0000 mg | ORAL_TABLET | Freq: Every day | ORAL | Status: DC
  Administered 2013-02-24 – 2013-02-26 (×3): 40 mg via ORAL
  Filled 2013-02-24 (×3): qty 1

## 2013-02-24 MED ORDER — ENOXAPARIN (LOVENOX) PATIENT EDUCATION KIT
PACK | Freq: Once | Status: AC
  Administered 2013-02-24: 18:00:00
  Filled 2013-02-24: qty 1

## 2013-02-24 NOTE — Progress Notes (Signed)
[facility] Progress Note    Matthew Velez:[id number] DOB: 07/05/2019 DOA: 02/19/2013 PCP: Ginette Otto, MD  Brief narrative: 79 year old male patient with recent diagnosis of prostate cancer. Was sent to the emergency department because he was found to be in atrial fibrillation at his physician's office. While in the emergency department he converted to sinus rhythm. In regards to recent history the wife had noted about one week prior the patient having lower extremity edema. The patient apparently had some type of syncopal episode with loss of consciousness for at least 30-60 seconds 24 hours prior to presentation. The wife did call EMS but the patient refused transport to the emergency department. A visiting nurse practitioner came by to evaluate the patient and noted the patient had lower extremity cellulitic type changes in the right leg several for the patient to the doctor's office. The doctor's office it was concerning that patient may have a DVT. Doppler studies performed in the emergency department did reveal DVT and CT angiogram of the chest revealed saddle pulmonary emboli. His EKG was also concerning for possible right heart strain. It was followed up with an echocardiogram did nt  Show any right heart strain.  Patient had no awareness of any tachycardia palpitations. We are currently awaiting his INR to be close to being therapeutic for discharge.   Assessment/Plan:    Saddle pulmonary embolus -has been weaned rapidly off oxygen -on full dose Lovenox -currently on Coumadin- co pay for Xarelto is $45-  inr subtherapeutic.  -suspect underlying malignancy precipitating factor -known moderate pulmonary HTN per ECHO 2009 - repeat echo is not significant for right heart strain.    Atrial fibrillation, transient -likely due to PE since resolved spontaneously - follow on tele     Right leg DVT/edema -anti coagulation    DM type 2 (diabetes mellitus, type 2) -CBG  controlled -continue Glucotrol/SSI - CBG (last 3)   Recent Labs  02/24/13 0607 02/24/13 1127 02/24/13 1622  GLUCAP 101* 76 118*        Prostate cancer -documented non aggressive approach to treatment -cont Proscar    Benign hypertension -BP soft -ACE I on hold - watch off lasix    Diastolic dysfunction/age related vs HTN -per ECHO 2009 -preserved LV function , but he is on lasix for pedal edema. Resumed from today .  DVT prophylaxis: Full dose Lovenox Code Status: DO NOT RESUSCITATE Family Communication: Patient and wife.  Disposition Plan: discharge in am hopefully the INR IS close to 2.  Isolation: None  Consultants: None  Procedures: Lower extremity venous duplex/right Findings consistent with acute deep vein thrombosis involving the popliteal and posterior tibial veins of the right lower extremity.  Antibiotics: None  HPI/Subjective: Awake and alert without any complaints. Pleasant and happy that he will be discharged soon.  Objective: Blood pressure 113/57, pulse 80, temperature 97 F (36.1 C), temperature source Oral, resp. rate 16, height 5\' 5"  (1.651 m), weight 76.9 kg (169 lb 8.5 oz), SpO2 97.00%.  Intake/Output Summary (Last 24 hours) at 02/24/13 1820 Last data filed at 02/24/13 1200  Gross per 24 hour  Intake    120 ml  Output    875 ml  Net   -755 ml    Exam: General: No acute respiratory distress Lungs: Clear to auscultation bilaterally without wheezes or crackles, RA Cardiovascular: Regular rate and rhythm without murmur gallop or rub normal S1 and S2, 1+ peripheral edema with mild erythema in a circumferential pattern just above the  ankle on right lower extremity, no JVD Abdomen: Nontender, nondistended, soft, bowel sounds positive, no rebound, no ascites, no appreciable mass Musculoskeletal: No significant cyanosis, clubbing of bilateral lower extremities Neurological: Alert and oriented x 3, moves all extremities x 4 without focal  neurological deficits, CN 2-12 intact  Scheduled Meds: Scheduled Meds: . enoxaparin (LOVENOX) injection  1 mg/kg Subcutaneous Q12H  . finasteride  5 mg Oral Daily  . furosemide  40 mg Oral Daily  . glipiZIDE  5 mg Oral BID AC  . pantoprazole  40 mg Oral Daily  . Warfarin - Pharmacist Dosing Inpatient   Does not apply q1800   Data Reviewed: Basic Metabolic Panel:  Recent Labs Lab 02/19/13 1120 02/24/13 0344  NA 141 139  K 3.8 4.2  CL 103 107  CO2  --  25  GLUCOSE 126* 101*  BUN 15 15  CREATININE 1.00 0.87  CALCIUM  --  8.6   CBC:  Recent Labs Lab 02/19/13 1106  02/20/13 0400 02/21/13 0455 02/22/13 0330 02/23/13 0510 02/24/13 0344  WBC 8.1  --  13.4* 9.8 8.1 8.7 7.5  NEUTROABS 5.7  --   --   --   --   --   --   HGB 13.7  < > 13.4 12.9* 13.2 12.9* 12.6*  HCT 40.8  < > 39.7 39.0 39.1 38.5* 37.7*  MCV 89.9  --  89.2 89.0 89.9 89.1 89.3  PLT 169  --  169 154 174 176 178  < > = values in this interval not displayed. Cardiac Enzymes:  Recent Labs Lab 02/19/13 1106  TROPONINI <0.30   CBG:  Recent Labs Lab 02/23/13 1623 02/23/13 2158 02/24/13 0607 02/24/13 1127 02/24/13 1622  GLUCAP 116* 91 101* 76 118*    Recent Results (from the past 240 hour(s))  MRSA PCR SCREENING     Status: None   Collection Time    02/19/13  4:37 PM      Result Value Range Status   MRSA by PCR NEGATIVE  NEGATIVE Final   Comment:            The GeneXpert MRSA Assay (FDA     approved for NASAL specimens     only), is one component of a     comprehensive MRSA colonization     surveillance program. It is not     intended to diagnose MRSA     infection nor to guide or     monitor treatment for     MRSA infections.    Kathlen Mody, MD [phone]

## 2013-02-24 NOTE — Progress Notes (Signed)
ANTICOAGULATION CONSULT NOTE - Follow Up Consult  Pharmacy Consult for Lovenox and Coumadin Indication: saddle pulmonary embolus, RLL DVT, new onset afib   Allergies  Allergen Reactions  . Other     Flu shot causes Anaphylaxis  . Iodine Rash    Patient Measurements: Height: 5\' 5"  (165.1 cm) Weight: 169 lb 8.5 oz (76.9 kg) IBW/kg (Calculated) : 61.5  Vital Signs: Temp: 97 F (36.1 C) (06/03 1305) Temp src: Oral (06/03 1305) BP: 113/57 mmHg (06/03 1305) Pulse Rate: 80 (06/03 1305)  Labs:  Recent Labs  02/22/13 0330 02/23/13 0510 02/24/13 0344  HGB 13.2 12.9* 12.6*  HCT 39.1 38.5* 37.7*  PLT 174 176 178  LABPROT 14.7 16.4* 18.0*  INR 1.17 1.35 1.54*  CREATININE  --   --  0.87    Estimated Creatinine Clearance: 53 ml/min (by C-G formula based on Cr of 0.87).  Assessment:  Overlap day # 6 Lovenox and Coumadin.  INR increasing slowly towards target range. Has had Coumadin 2.5 mg x 2 days, then 4 mg x 2 days, then 6 mg. Renal function stable. Slight downward trend in CBC. No bleeding noted. Change to Xarelto considered, but not changing. Patient is expecting discharge in am if INR >1.8. Facility RN could give Lovenox injections.  Goal of Therapy:  INR 2-3 Anti-Xa level 0.6-1.2 units/ml 4hrs after LMWH dose given Monitor platelets by anticoagulation protocol: Yes   Plan:   Repeat Coumadin to 6 mg today.  Continue Lovenox 75 mg SQ q12hrs until INR >2 x 24 hrs.  Daily PT/INR.    CBC at least every 3 days while in the hospital on Lovenox.  Dennie Fetters, [place] Pager: [phone] 02/24/2013,4:30 PM

## 2013-02-25 LAB — CBC
HCT: 38.3 % — ABNORMAL LOW (ref 39.0–52.0)
Hemoglobin: 12.8 g/dL — ABNORMAL LOW (ref 13.0–17.0)
MCHC: 33.4 g/dL (ref 30.0–36.0)
RDW: 14.6 % (ref 11.5–15.5)
WBC: 8.6 10*3/uL (ref 4.0–10.5)

## 2013-02-25 LAB — PROTIME-INR
INR: 1.64 — ABNORMAL HIGH (ref 0.00–1.49)
Prothrombin Time: 18.9 seconds — ABNORMAL HIGH (ref 11.6–15.2)

## 2013-02-25 LAB — GLUCOSE, CAPILLARY: Glucose-Capillary: 148 mg/dL — ABNORMAL HIGH (ref 70–99)

## 2013-02-25 MED ORDER — RIVAROXABAN 15 MG PO TABS
15.0000 mg | ORAL_TABLET | Freq: Two times a day (BID) | ORAL | Status: DC
Start: 2013-02-25 — End: 2013-02-26
  Administered 2013-02-25 – 2013-02-26 (×3): 15 mg via ORAL
  Filled 2013-02-25 (×4): qty 1

## 2013-02-25 NOTE — Progress Notes (Signed)
ANTICOAGULATION CONSULT NOTE - Follow Up Consult  Pharmacy Consult for Lovenox and Coumadin-> Xarelto Indication: saddle pulmonary embolus, RLL DVT, new onset afib   Patient Measurements: Height: 5\' 5"  (165.1 cm) Weight: 169 lb 8.5 oz (76.9 kg) IBW/kg (Calculated) : 61.5  Vital Signs: Temp: 97.9 F (36.6 C) (06/04 0450) Temp src: Oral (06/04 0450) BP: 107/59 mmHg (06/04 0450) Pulse Rate: 74 (06/04 0450)  Labs:  Recent Labs  02/23/13 0510 02/24/13 0344 02/25/13 0525  HGB 12.9* 12.6* 12.8*  HCT 38.5* 37.7* 38.3*  PLT 176 178 194  LABPROT 16.4* 18.0* 18.9*  INR 1.35 1.54* 1.64*  CREATININE  --  0.87  --     Estimated Creatinine Clearance: 53 ml/min (by C-G formula based on Cr of 0.87).  Assessment:  Overlap day # 7 Lovenox and Coumadin.  INR increasing slowly, but remains subtherapeutic. Has had Coumadin 2.5 mg x 2 days, then 4 mg x 2 days, then 6 mg x 2. Renal function stable. Slight downward trend in CBC. No bleeding noted.    Discussed with Dr. Isidoro Donning, and with patient and spouse. Changed to Xarelto today. First dose given.  Goal of Therapy:  Appropriate Xarelto dose for renal function & indication Monitor platelets by anticoagulation protocol: Yes   Plan:   Xarelto 15 mg BID with food x 21 days, then 20 mg daily with lunch (his biggest meal of the day).  Dennie Fetters, [place] Pager: [phone] 02/25/2013,12:38 PM

## 2013-02-25 NOTE — Progress Notes (Signed)
Patient ID: Matthew Velez  male  AOZ:[id number]    DOB: Jul 08, 1919    DOA: 02/19/2013  PCP: Ginette Otto, MD  Assessment/Plan: Principal Problem:   Saddle pulmonary embolus with right leg DVT - Discussed in detail with pharmacy, case management, will start xarelto today - Discussed in detail with the patient, able to afford the co-pay $45   Active Problems:   Atrial fibrillation, transient - Rate control, continue xarelto    DM type 2 (diabetes mellitus, type 2) -CBGs controlled     Prostate cancer    Benign hypertension: Currently stable    Diastolic dysfunction- currently stable, continue Lasix  DVT Prophylaxis:  Code Status:  Disposition:We'll discontinue Coumadin, start xarelto, DC in a.m.  Subjective: Anxious about the INR, no chest pain, shortness of breath, fevers or chills  Objective: Weight change:   Intake/Output Summary (Last 24 hours) at 02/25/13 1552 Last data filed at 02/25/13 1300  Gross per 24 hour  Intake    480 ml  Output   3301 ml  Net  -2821 ml   Blood pressure 107/59, pulse 74, temperature 97.9 F (36.6 C), temperature source Oral, resp. rate 18, height 5\' 5"  (1.651 m), weight 76.9 kg (169 lb 8.5 oz), SpO2 94.00%.  Physical Exam: General: Alert and awake, oriented x3, not in any acute distress. HEENT: anicteric sclera, PERLA, EOMI CVS: S1-S2 clear, no murmur rubs or gallops Chest: clear to auscultation bilaterally, no wheezing, rales or rhonchi Abdomen: soft nontender, nondistended, normal bowel sounds  Extremities: no cyanosis, clubbing or edema noted bilaterally Neuro: Cranial nerves II-XII intact, no focal neurological deficits  Lab Results: Basic Metabolic Panel:  Recent Labs Lab 02/19/13 1120 02/24/13 0344  NA 141 139  K 3.8 4.2  CL 103 107  CO2  --  25  GLUCOSE 126* 101*  BUN 15 15  CREATININE 1.00 0.87  CALCIUM  --  8.6   Liver Function Tests: No results found for this basename: AST, ALT, ALKPHOS, BILITOT, PROT,  ALBUMIN,  in the last 168 hours No results found for this basename: LIPASE, AMYLASE,  in the last 168 hours No results found for this basename: AMMONIA,  in the last 168 hours CBC:  Recent Labs Lab 02/19/13 1106  02/24/13 0344 02/25/13 0525  WBC 8.1  < > 7.5 8.6  NEUTROABS 5.7  --   --   --   HGB 13.7  < > 12.6* 12.8*  HCT 40.8  < > 37.7* 38.3*  MCV 89.9  < > 89.3 89.5  PLT 169  < > 178 194  < > = values in this interval not displayed. Cardiac Enzymes:  Recent Labs Lab 02/19/13 1106  TROPONINI <0.30   BNP: No components found with this basename: POCBNP,  CBG:  Recent Labs Lab 02/24/13 1127 02/24/13 1622 02/24/13 2125 02/25/13 0659 02/25/13 1112  GLUCAP 76 118* 131* 97 148*     Micro Results: Recent Results (from the past 240 hour(s))  MRSA PCR SCREENING     Status: None   Collection Time    02/19/13  4:37 PM      Result Value Range Status   MRSA by PCR NEGATIVE  NEGATIVE Final   Comment:            The GeneXpert MRSA Assay (FDA     approved for NASAL specimens     only), is one component of a     comprehensive MRSA colonization     surveillance program. It is  not     intended to diagnose MRSA     infection nor to guide or     monitor treatment for     MRSA infections.    Studies/Results: Ct Angio Chest Pe W/cm &/or Wo Cm  02/19/2013   *RADIOLOGY REPORT*  Clinical Data: Syncope.  New onset atrial fibrillation. Hypertension.  CT ANGIOGRAPHY CHEST  Technique:  Multidetector CT imaging of the chest using the standard protocol during bolus administration of intravenous contrast. Multiplanar reconstructed images including MIPs were obtained and reviewed to evaluate the vascular anatomy.  Contrast: OMNIPAQUE IOHEXOL 350 MG/ML SOLN  Comparison: 02/19/2013 radiographs  Findings: Acute saddle embolus noted, extending across the right and left pulmonary arteries and into the left upper lobe and bilateral lower lobe pulmonary arteries.  Most of the thrombus is  central in location although smaller more peripheral emboli are visible.  Interventricular septum flattened.  No obvious filling defect in the chambers of the heart.  Incidental azygos fissure. Punctate calcifications in the liver and spleen noted.  Small type 1 hiatal hernia.  There is interstitial accentuation, particularly at the lung bases. Mild lingular subsegmental atelectasis.  Very faint mosaic attenuation in the lungs.  Thoracic spondylosis observed.  There is some atherosclerotic calcification in the splenic artery.  IMPRESSION:  1.  Large acute saddle embolus extending into the right and left pulmonary arteries and branches.  Clot burden is large.  Flattening of the interventricular septum may reflect elevated right heart pressures.  There is likely some secondary mosaic attenuation and interstitial accentuation. 2.  Old granulomatous disease. 3.  Small hiatal hernia.  I discussed the finding of large acute saddle embolus with Dr. Eber Hong at 3:01 p.m. on 02/19/2013 by telephone.   Original Report Authenticated By: Gaylyn Rong, M.D.   Dg Chest Port 1 View  02/19/2013   *RADIOLOGY REPORT*  Clinical Data: Chest pain.  PORTABLE CHEST - 1 VIEW  Comparison: 03/15/2010  Findings: Left base scarring or atelectasis, improved since prior study.  Right lung is clear.  Heart is upper limits normal in size. No effusions or acute bony abnormality.  IMPRESSION: Left base scarring or atelectasis.   Original Report Authenticated By: Charlett Nose, M.D.    Medications: Scheduled Meds: . finasteride  5 mg Oral Daily  . furosemide  40 mg Oral Daily  . glipiZIDE  5 mg Oral BID AC  . pantoprazole  40 mg Oral Daily  . rivaroxaban  15 mg Oral BID      LOS: 6 days   Jaana Brodt M.D. [facility] 02/25/2013, 3:52 PM Pager: [phone]  If 7PM-7AM, please contact night-coverage [email or web address] Password [email or web address]

## 2013-02-26 LAB — CBC
HCT: 36.5 % — ABNORMAL LOW (ref 39.0–52.0)
MCH: 29.6 pg (ref 26.0–34.0)
MCV: 89.9 fL (ref 78.0–100.0)
RBC: 4.06 MIL/uL — ABNORMAL LOW (ref 4.22–5.81)
WBC: 8.3 10*3/uL (ref 4.0–10.5)

## 2013-02-26 LAB — GLUCOSE, CAPILLARY
Glucose-Capillary: 60 mg/dL — ABNORMAL LOW (ref 70–99)
Glucose-Capillary: 118 mg/dL — ABNORMAL HIGH (ref 70–99)

## 2013-02-26 MED ORDER — RIVAROXABAN 20 MG PO TABS: 20.0000 mg | ORAL_TABLET | Freq: Every day | ORAL | Status: DC

## 2013-02-26 MED ORDER — RIVAROXABAN 15 MG PO TABS: 15.0000 mg | ORAL_TABLET | Freq: Two times a day (BID) | ORAL | Status: DC

## 2013-02-26 NOTE — Progress Notes (Signed)
Pt/family given discharge instructions, medication lists, follow up appointments, and when to call the doctor.  Pt/family verbalizes understanding. Transferred pt and wife to [place] tower for discharge.  All questions answered. Matthew Velez

## 2013-02-26 NOTE — Discharge Summary (Signed)
Physician Discharge Summary  Patient ID: Matthew Velez MRN: [id number] DOB/AGE: 77-01-1919 77 y.o.  Admit date: 02/19/2013 Discharge date: 02/26/2013  Primary Care Physician:  Ginette Otto, MD  Discharge Diagnoses:    . Saddle pulmonary embolus . Atrial fibrillation, transient . Right leg DVT . DM type 2 (diabetes mellitus, type 2) . Prostate cancer . Benign hypertension . Diastolic dysfunction  Consults: None   Recommendations for Outpatient Follow-up:  Patient recommendations; Please take xarelto 15mg  twice a day with food for 20 more days. Then start xarelto 20mg  daily with food indefinitely.  Allergies:   Allergies  Allergen Reactions  . Other     Flu shot causes Anaphylaxis  . Iodine Rash     Discharge Medications:   Medication List    STOP taking these medications       aspirin EC 81 MG tablet      TAKE these medications       finasteride 5 MG tablet  Commonly known as:  PROSCAR  Take 5 mg by mouth daily.     furosemide 40 MG tablet  Commonly known as:  LASIX  Take 40 mg by mouth daily.     glipiZIDE 5 MG tablet  Commonly known as:  GLUCOTROL  Take 5 mg by mouth 2 (two) times daily before a meal.     Glucosamine Sulfate 500 MG Tabs  Take 1 tablet by mouth daily.     lisinopril 10 MG tablet  Commonly known as:  PRINIVIL,ZESTRIL  Take 10 mg by mouth daily.     omeprazole 20 MG capsule  Commonly known as:  PRILOSEC  Take 20 mg by mouth daily.     Rivaroxaban 15 MG Tabs tablet  Commonly known as:  XARELTO  Take 1 tablet (15 mg total) by mouth 2 (two) times daily. For 20 days, then start 20mg  daily dose     Rivaroxaban 20 MG Tabs  Commonly known as:  XARELTO  Take 1 tablet (20 mg total) by mouth daily. With lunch (with food)         Brief H and P: For complete details please refer to admission H and P, but in brief Matthew Velez is a 79 y.o. male who was sent to the emergency room with atrial fibrillation. Apparently he converted  to sinus rhythm in the emergency room.Marland Kitchen He has no previous history of atrial fibrillation. Patient's wife had reported that about a week ago, she noticed his legs started swelling. A day before the admission,  he lost consciousness for about a minute or so. EMS was called but patient refused transfer to the emergency room. He was seen by a nurse practitioner and sent to the emergency room for further workup. In the ER, EKG showed atrial fib but evidence of right heart strain. Doppler of the leg showed DVT. CT angiogram of the chest shows saddle embolus, as mentioned below. The patient denies chest pain or shortness of breath. He denies palpitations. He has had no recent immobility or travel. He has no previous history of DVT or PE. He was recently diagnosed with prostate cancer, and the plan is for a nonaggressive approach due to his age. Patient was started on Lovenox in the emergency room.   Hospital Course:  Saddle pulmonary embolus with right leg DVT: Patient was placed on Lovenox and Coumadin. Despite all 40s overlap of Lovenox and Coumadin, his INR only increased 1.6. Case management assistance approved him for xarelto which was started on 02/25/2013,  patient has tolerated xarelto.  The patient was given instructions to take her xarelto twice a day for 20 days (started on 6/4) and then daily for indefinite period. 2-D echo showed EF of 55-60%, right ventricle systolic function was normal  Atrial fibrillation, transient : Rate controlled, continue xarelto   DM type 2 (diabetes mellitus, type 2) -CBGs controlled   Prostate cancer : Patient has decided for nonaggressive approach due to his age   Benign hypertension: Currently stable   Diastolic dysfunction- currently stable, continue Lasix    Day of Discharge BP 124/69  Pulse 83  Temp(Src) 97.6 F (36.4 C) (Oral)  Resp 18  Ht 5\' 5"  (1.651 m)  Wt 76.9 kg (169 lb 8.5 oz)  BMI 28.21 kg/m2  SpO2 98%  Physical Exam: General: Alert and  awake oriented x3 not in any acute distress. CVS: S1-S2 clear no murmur rubs or gallops Chest: clear to auscultation bilaterally, no wheezing rales or rhonchi Abdomen: soft nontender, nondistended, normal bowel sounds,  Extremities: no cyanosis, clubbing or edema noted bilaterally    The results of significant diagnostics from this hospitalization (including imaging, microbiology, ancillary and laboratory) are listed below for reference.    LAB RESULTS: Basic Metabolic Panel:  Recent Labs Lab 02/24/13 0344  NA 139  K 4.2  CL 107  CO2 25  GLUCOSE 101*  BUN 15  CREATININE 0.87  CALCIUM 8.6   Liver Function Tests: No results found for this basename: AST, ALT, ALKPHOS, BILITOT, PROT, ALBUMIN,  in the last 168 hours No results found for this basename: LIPASE, AMYLASE,  in the last 168 hours No results found for this basename: AMMONIA,  in the last 168 hours CBC:  Recent Labs Lab 02/25/13 0525 02/26/13 0340  WBC 8.6 8.3  HGB 12.8* 12.0*  HCT 38.3* 36.5*  MCV 89.5 89.9  PLT 194 216   Cardiac Enzymes: No results found for this basename: CKTOTAL, CKMB, CKMBINDEX, TROPONINI,  in the last 168 hours BNP: No components found with this basename: POCBNP,  CBG:  Recent Labs Lab 02/26/13 1131 02/26/13 1243  GLUCAP 60* 118*    Significant Diagnostic Studies:  Ct Angio Chest Pe W/cm &/or Wo Cm  02/19/2013   *RADIOLOGY REPORT*  Clinical Data: Syncope.  New onset atrial fibrillation. Hypertension.  CT ANGIOGRAPHY CHEST  Technique:  Multidetector CT imaging of the chest using the standard protocol during bolus administration of intravenous contrast. Multiplanar reconstructed images including MIPs were obtained and reviewed to evaluate the vascular anatomy.  Contrast: OMNIPAQUE IOHEXOL 350 MG/ML SOLN  Comparison: 02/19/2013 radiographs  Findings: Acute saddle embolus noted, extending across the right and left pulmonary arteries and into the left upper lobe and bilateral lower  lobe pulmonary arteries.  Most of the thrombus is central in location although smaller more peripheral emboli are visible.  Interventricular septum flattened.  No obvious filling defect in the chambers of the heart.  Incidental azygos fissure. Punctate calcifications in the liver and spleen noted.  Small type 1 hiatal hernia.  There is interstitial accentuation, particularly at the lung bases. Mild lingular subsegmental atelectasis.  Very faint mosaic attenuation in the lungs.  Thoracic spondylosis observed.  There is some atherosclerotic calcification in the splenic artery.  IMPRESSION:  1.  Large acute saddle embolus extending into the right and left pulmonary arteries and branches.  Clot burden is large.  Flattening of the interventricular septum may reflect elevated right heart pressures.  There is likely some secondary mosaic attenuation and interstitial  accentuation. 2.  Old granulomatous disease. 3.  Small hiatal hernia.  I discussed the finding of large acute saddle embolus with Dr. Eber Hong at 3:01 p.m. on 02/19/2013 by telephone.   Original Report Authenticated By: Gaylyn Rong, M.D.   Dg Chest Port 1 View  02/19/2013   *RADIOLOGY REPORT*  Clinical Data: Chest pain.  PORTABLE CHEST - 1 VIEW  Comparison: 03/15/2010  Findings: Left base scarring or atelectasis, improved since prior study.  Right lung is clear.  Heart is upper limits normal in size. No effusions or acute bony abnormality.  IMPRESSION: Left base scarring or atelectasis.   Original Report Authenticated By: Charlett Nose, M.D.    2D ECHO: Study Conclusions  - Left ventricle: The cavity size was normal. Systolic function was normal. The estimated ejection fraction was in the range of 55% to 60%. Although no diagnostic regional wall motion abnormality was identified, this possibility cannot be completely excluded on the basis of this study. - Aortic valve: Trivial regurgitation. - Left atrium: The atrium was mildly  dilated. - Pulmonary arteries: Systolic pressure was mildly increased. PA peak pressure: 35mm Hg (S).    Disposition and Follow-up:     Discharge Orders   Future Orders Complete By Expires     Diet - carb modified  As directed     Discharge instructions  As directed     Comments:      Please take xarelto 15mg  twice a day with food for 20 more days. Then start xarelto 20mg  daily with food indefinitely.    Increase activity slowly  As directed         DISPOSITION:Home  DIET: Carb modified ACTIVITY: As tolerated    DISCHARGE FOLLOW-UP Follow-up Information   Follow up with Ginette Otto, MD. Schedule an appointment as soon as possible for a visit in 2 weeks.   Contact information:   [address] [place] [address] [phone]       Time spent on Discharge: 37 mins  Signed:   RAI,RIPUDEEP M.D. [facility] 02/26/2013, 12:59 PM Pager: [phone]

## 2013-09-29 ENCOUNTER — Other Ambulatory Visit (HOSPITAL_COMMUNITY): Payer: Self-pay | Admitting: Cardiology

## 2013-09-29 DIAGNOSIS — L98499 Non-pressure chronic ulcer of skin of other sites with unspecified severity: Principal | ICD-10-CM

## 2013-09-29 DIAGNOSIS — I739 Peripheral vascular disease, unspecified: Secondary | ICD-10-CM

## 2013-09-30 ENCOUNTER — Encounter: Payer: Self-pay | Admitting: Cardiovascular Disease

## 2013-09-30 ENCOUNTER — Ambulatory Visit (HOSPITAL_COMMUNITY): Payer: Medicare HMO | Attending: Cardiovascular Disease

## 2013-09-30 DIAGNOSIS — I2510 Atherosclerotic heart disease of native coronary artery without angina pectoris: Secondary | ICD-10-CM | POA: Insufficient documentation

## 2013-09-30 DIAGNOSIS — I10 Essential (primary) hypertension: Secondary | ICD-10-CM | POA: Insufficient documentation

## 2013-09-30 DIAGNOSIS — Z87891 Personal history of nicotine dependence: Secondary | ICD-10-CM | POA: Insufficient documentation

## 2013-09-30 DIAGNOSIS — L98499 Non-pressure chronic ulcer of skin of other sites with unspecified severity: Secondary | ICD-10-CM

## 2013-09-30 DIAGNOSIS — I4891 Unspecified atrial fibrillation: Secondary | ICD-10-CM | POA: Insufficient documentation

## 2013-09-30 DIAGNOSIS — E119 Type 2 diabetes mellitus without complications: Secondary | ICD-10-CM | POA: Insufficient documentation

## 2013-09-30 DIAGNOSIS — I739 Peripheral vascular disease, unspecified: Secondary | ICD-10-CM

## 2013-09-30 DIAGNOSIS — E1159 Type 2 diabetes mellitus with other circulatory complications: Secondary | ICD-10-CM

## 2013-09-30 DIAGNOSIS — R209 Unspecified disturbances of skin sensation: Secondary | ICD-10-CM | POA: Insufficient documentation

## 2014-10-14 DIAGNOSIS — I10 Essential (primary) hypertension: Secondary | ICD-10-CM | POA: Diagnosis not present

## 2014-10-14 DIAGNOSIS — E1142 Type 2 diabetes mellitus with diabetic polyneuropathy: Secondary | ICD-10-CM | POA: Diagnosis not present

## 2014-10-14 DIAGNOSIS — I4891 Unspecified atrial fibrillation: Secondary | ICD-10-CM | POA: Diagnosis not present

## 2014-10-14 DIAGNOSIS — Z79899 Other long term (current) drug therapy: Secondary | ICD-10-CM | POA: Diagnosis not present

## 2015-04-14 DIAGNOSIS — K219 Gastro-esophageal reflux disease without esophagitis: Secondary | ICD-10-CM | POA: Diagnosis not present

## 2015-04-14 DIAGNOSIS — I4891 Unspecified atrial fibrillation: Secondary | ICD-10-CM | POA: Diagnosis not present

## 2015-04-14 DIAGNOSIS — L89151 Pressure ulcer of sacral region, stage 1: Secondary | ICD-10-CM | POA: Diagnosis not present

## 2015-04-14 DIAGNOSIS — Z79899 Other long term (current) drug therapy: Secondary | ICD-10-CM | POA: Diagnosis not present

## 2015-04-14 DIAGNOSIS — Z1389 Encounter for screening for other disorder: Secondary | ICD-10-CM | POA: Diagnosis not present

## 2015-04-14 DIAGNOSIS — Z0000 Encounter for general adult medical examination without abnormal findings: Secondary | ICD-10-CM | POA: Diagnosis not present

## 2015-04-14 DIAGNOSIS — E1142 Type 2 diabetes mellitus with diabetic polyneuropathy: Secondary | ICD-10-CM | POA: Diagnosis not present

## 2015-04-14 DIAGNOSIS — I10 Essential (primary) hypertension: Secondary | ICD-10-CM | POA: Diagnosis not present

## 2015-07-04 DIAGNOSIS — C61 Malignant neoplasm of prostate: Secondary | ICD-10-CM | POA: Diagnosis not present

## 2015-07-26 DIAGNOSIS — N401 Enlarged prostate with lower urinary tract symptoms: Secondary | ICD-10-CM | POA: Diagnosis not present

## 2015-07-26 DIAGNOSIS — C61 Malignant neoplasm of prostate: Secondary | ICD-10-CM | POA: Diagnosis not present

## 2015-07-26 DIAGNOSIS — N281 Cyst of kidney, acquired: Secondary | ICD-10-CM | POA: Diagnosis not present

## 2015-07-26 DIAGNOSIS — R3915 Urgency of urination: Secondary | ICD-10-CM | POA: Diagnosis not present

## 2015-08-03 DIAGNOSIS — H401122 Primary open-angle glaucoma, left eye, moderate stage: Secondary | ICD-10-CM | POA: Diagnosis not present

## 2015-08-03 DIAGNOSIS — H401111 Primary open-angle glaucoma, right eye, mild stage: Secondary | ICD-10-CM | POA: Diagnosis not present

## 2015-08-03 DIAGNOSIS — D3132 Benign neoplasm of left choroid: Secondary | ICD-10-CM | POA: Diagnosis not present

## 2015-08-03 DIAGNOSIS — E119 Type 2 diabetes mellitus without complications: Secondary | ICD-10-CM | POA: Diagnosis not present

## 2015-11-02 DIAGNOSIS — H401131 Primary open-angle glaucoma, bilateral, mild stage: Secondary | ICD-10-CM | POA: Diagnosis not present

## 2015-11-03 DIAGNOSIS — Z7984 Long term (current) use of oral hypoglycemic drugs: Secondary | ICD-10-CM | POA: Diagnosis not present

## 2015-11-03 DIAGNOSIS — I2699 Other pulmonary embolism without acute cor pulmonale: Secondary | ICD-10-CM | POA: Diagnosis not present

## 2015-11-03 DIAGNOSIS — Z79899 Other long term (current) drug therapy: Secondary | ICD-10-CM | POA: Diagnosis not present

## 2015-11-03 DIAGNOSIS — I4891 Unspecified atrial fibrillation: Secondary | ICD-10-CM | POA: Diagnosis not present

## 2015-11-03 DIAGNOSIS — E1142 Type 2 diabetes mellitus with diabetic polyneuropathy: Secondary | ICD-10-CM | POA: Diagnosis not present

## 2015-11-03 DIAGNOSIS — I10 Essential (primary) hypertension: Secondary | ICD-10-CM | POA: Diagnosis not present

## 2015-11-21 ENCOUNTER — Ambulatory Visit
Admission: RE | Admit: 2015-11-21 | Discharge: 2015-11-21 | Disposition: A | Discharge status: Home / Self Care | Payer: Medicare HMO | Source: Ambulatory Visit | Attending: Geriatric Medicine | Admitting: Geriatric Medicine

## 2015-11-21 ENCOUNTER — Other Ambulatory Visit: Payer: Self-pay | Admitting: Geriatric Medicine

## 2015-11-21 DIAGNOSIS — J40 Bronchitis, not specified as acute or chronic: Secondary | ICD-10-CM

## 2015-11-21 DIAGNOSIS — J209 Acute bronchitis, unspecified: Secondary | ICD-10-CM | POA: Diagnosis not present

## 2015-11-21 DIAGNOSIS — R05 Cough: Secondary | ICD-10-CM | POA: Diagnosis not present

## 2015-12-02 DIAGNOSIS — R531 Weakness: Secondary | ICD-10-CM | POA: Diagnosis not present

## 2016-04-12 DIAGNOSIS — R310 Gross hematuria: Secondary | ICD-10-CM | POA: Diagnosis not present

## 2016-05-15 DIAGNOSIS — R310 Gross hematuria: Secondary | ICD-10-CM | POA: Diagnosis not present

## 2016-05-21 DIAGNOSIS — H401131 Primary open-angle glaucoma, bilateral, mild stage: Secondary | ICD-10-CM | POA: Diagnosis not present

## 2016-05-31 DIAGNOSIS — K219 Gastro-esophageal reflux disease without esophagitis: Secondary | ICD-10-CM | POA: Diagnosis not present

## 2016-05-31 DIAGNOSIS — Z79899 Other long term (current) drug therapy: Secondary | ICD-10-CM | POA: Diagnosis not present

## 2016-05-31 DIAGNOSIS — L989 Disorder of the skin and subcutaneous tissue, unspecified: Secondary | ICD-10-CM | POA: Diagnosis not present

## 2016-05-31 DIAGNOSIS — Z0000 Encounter for general adult medical examination without abnormal findings: Secondary | ICD-10-CM | POA: Diagnosis not present

## 2016-05-31 DIAGNOSIS — I2699 Other pulmonary embolism without acute cor pulmonale: Secondary | ICD-10-CM | POA: Diagnosis not present

## 2016-05-31 DIAGNOSIS — E1142 Type 2 diabetes mellitus with diabetic polyneuropathy: Secondary | ICD-10-CM | POA: Diagnosis not present

## 2016-05-31 DIAGNOSIS — I10 Essential (primary) hypertension: Secondary | ICD-10-CM | POA: Diagnosis not present

## 2016-05-31 DIAGNOSIS — I4891 Unspecified atrial fibrillation: Secondary | ICD-10-CM | POA: Diagnosis not present

## 2016-05-31 DIAGNOSIS — Z1389 Encounter for screening for other disorder: Secondary | ICD-10-CM | POA: Diagnosis not present

## 2016-05-31 DIAGNOSIS — Z7984 Long term (current) use of oral hypoglycemic drugs: Secondary | ICD-10-CM | POA: Diagnosis not present

## 2016-05-31 DIAGNOSIS — M7711 Lateral epicondylitis, right elbow: Secondary | ICD-10-CM | POA: Diagnosis not present

## 2016-05-31 DIAGNOSIS — D692 Other nonthrombocytopenic purpura: Secondary | ICD-10-CM | POA: Diagnosis not present

## 2016-07-02 DIAGNOSIS — L8990 Pressure ulcer of unspecified site, unspecified stage: Secondary | ICD-10-CM | POA: Diagnosis not present

## 2016-07-02 DIAGNOSIS — L309 Dermatitis, unspecified: Secondary | ICD-10-CM | POA: Diagnosis not present

## 2016-07-02 DIAGNOSIS — L570 Actinic keratosis: Secondary | ICD-10-CM | POA: Diagnosis not present

## 2016-07-02 DIAGNOSIS — C44519 Basal cell carcinoma of skin of other part of trunk: Secondary | ICD-10-CM | POA: Diagnosis not present

## 2016-07-02 DIAGNOSIS — C44612 Basal cell carcinoma of skin of right upper limb, including shoulder: Secondary | ICD-10-CM | POA: Diagnosis not present

## 2016-08-13 DIAGNOSIS — Z85828 Personal history of other malignant neoplasm of skin: Secondary | ICD-10-CM | POA: Diagnosis not present

## 2016-08-13 DIAGNOSIS — L820 Inflamed seborrheic keratosis: Secondary | ICD-10-CM | POA: Diagnosis not present

## 2016-09-04 DIAGNOSIS — C61 Malignant neoplasm of prostate: Secondary | ICD-10-CM | POA: Diagnosis not present

## 2016-09-11 DIAGNOSIS — C61 Malignant neoplasm of prostate: Secondary | ICD-10-CM | POA: Diagnosis not present

## 2016-09-11 DIAGNOSIS — R310 Gross hematuria: Secondary | ICD-10-CM | POA: Diagnosis not present

## 2016-09-11 DIAGNOSIS — N401 Enlarged prostate with lower urinary tract symptoms: Secondary | ICD-10-CM | POA: Diagnosis not present

## 2016-09-11 DIAGNOSIS — R3915 Urgency of urination: Secondary | ICD-10-CM | POA: Diagnosis not present

## 2016-11-12 DIAGNOSIS — Z85828 Personal history of other malignant neoplasm of skin: Secondary | ICD-10-CM | POA: Diagnosis not present

## 2016-11-12 DIAGNOSIS — L821 Other seborrheic keratosis: Secondary | ICD-10-CM | POA: Diagnosis not present

## 2016-11-21 DIAGNOSIS — H524 Presbyopia: Secondary | ICD-10-CM | POA: Diagnosis not present

## 2016-11-21 DIAGNOSIS — H26492 Other secondary cataract, left eye: Secondary | ICD-10-CM | POA: Diagnosis not present

## 2016-11-21 DIAGNOSIS — H401131 Primary open-angle glaucoma, bilateral, mild stage: Secondary | ICD-10-CM | POA: Diagnosis not present

## 2016-11-21 DIAGNOSIS — E119 Type 2 diabetes mellitus without complications: Secondary | ICD-10-CM | POA: Diagnosis not present

## 2016-11-29 DIAGNOSIS — Z7984 Long term (current) use of oral hypoglycemic drugs: Secondary | ICD-10-CM | POA: Diagnosis not present

## 2016-11-29 DIAGNOSIS — I4891 Unspecified atrial fibrillation: Secondary | ICD-10-CM | POA: Diagnosis not present

## 2016-11-29 DIAGNOSIS — Z79899 Other long term (current) drug therapy: Secondary | ICD-10-CM | POA: Diagnosis not present

## 2016-11-29 DIAGNOSIS — I10 Essential (primary) hypertension: Secondary | ICD-10-CM | POA: Diagnosis not present

## 2016-11-29 DIAGNOSIS — E1142 Type 2 diabetes mellitus with diabetic polyneuropathy: Secondary | ICD-10-CM | POA: Diagnosis not present

## 2017-03-29 DIAGNOSIS — I4891 Unspecified atrial fibrillation: Secondary | ICD-10-CM | POA: Diagnosis not present

## 2017-03-29 DIAGNOSIS — Z79899 Other long term (current) drug therapy: Secondary | ICD-10-CM | POA: Diagnosis not present

## 2017-03-29 DIAGNOSIS — I10 Essential (primary) hypertension: Secondary | ICD-10-CM | POA: Diagnosis not present

## 2017-03-29 DIAGNOSIS — R000 Tachycardia, unspecified: Secondary | ICD-10-CM | POA: Diagnosis not present

## 2017-03-29 DIAGNOSIS — R6883 Chills (without fever): Secondary | ICD-10-CM | POA: Diagnosis not present

## 2017-03-29 DIAGNOSIS — E1142 Type 2 diabetes mellitus with diabetic polyneuropathy: Secondary | ICD-10-CM | POA: Diagnosis not present

## 2017-03-29 DIAGNOSIS — R350 Frequency of micturition: Secondary | ICD-10-CM | POA: Diagnosis not present

## 2017-04-10 DIAGNOSIS — I4891 Unspecified atrial fibrillation: Secondary | ICD-10-CM | POA: Diagnosis not present

## 2017-04-10 DIAGNOSIS — R600 Localized edema: Secondary | ICD-10-CM | POA: Diagnosis not present

## 2017-04-10 DIAGNOSIS — Z79899 Other long term (current) drug therapy: Secondary | ICD-10-CM | POA: Diagnosis not present

## 2017-04-10 DIAGNOSIS — I10 Essential (primary) hypertension: Secondary | ICD-10-CM | POA: Diagnosis not present

## 2017-04-10 DIAGNOSIS — I872 Venous insufficiency (chronic) (peripheral): Secondary | ICD-10-CM | POA: Diagnosis not present

## 2017-05-07 DIAGNOSIS — I10 Essential (primary) hypertension: Secondary | ICD-10-CM | POA: Diagnosis not present

## 2017-05-07 DIAGNOSIS — E1142 Type 2 diabetes mellitus with diabetic polyneuropathy: Secondary | ICD-10-CM | POA: Diagnosis not present

## 2017-05-07 DIAGNOSIS — I4891 Unspecified atrial fibrillation: Secondary | ICD-10-CM | POA: Diagnosis not present

## 2017-06-13 DIAGNOSIS — Z1389 Encounter for screening for other disorder: Secondary | ICD-10-CM | POA: Diagnosis not present

## 2017-06-13 DIAGNOSIS — I10 Essential (primary) hypertension: Secondary | ICD-10-CM | POA: Diagnosis not present

## 2017-06-13 DIAGNOSIS — Z79899 Other long term (current) drug therapy: Secondary | ICD-10-CM | POA: Diagnosis not present

## 2017-06-13 DIAGNOSIS — L989 Disorder of the skin and subcutaneous tissue, unspecified: Secondary | ICD-10-CM | POA: Diagnosis not present

## 2017-06-13 DIAGNOSIS — D692 Other nonthrombocytopenic purpura: Secondary | ICD-10-CM | POA: Diagnosis not present

## 2017-06-13 DIAGNOSIS — Z0000 Encounter for general adult medical examination without abnormal findings: Secondary | ICD-10-CM | POA: Diagnosis not present

## 2017-06-13 DIAGNOSIS — E1142 Type 2 diabetes mellitus with diabetic polyneuropathy: Secondary | ICD-10-CM | POA: Diagnosis not present

## 2017-06-13 DIAGNOSIS — K219 Gastro-esophageal reflux disease without esophagitis: Secondary | ICD-10-CM | POA: Diagnosis not present

## 2017-06-13 DIAGNOSIS — I4891 Unspecified atrial fibrillation: Secondary | ICD-10-CM | POA: Diagnosis not present

## 2017-06-19 DIAGNOSIS — L720 Epidermal cyst: Secondary | ICD-10-CM | POA: Diagnosis not present

## 2017-06-19 DIAGNOSIS — D485 Neoplasm of uncertain behavior of skin: Secondary | ICD-10-CM | POA: Diagnosis not present

## 2017-06-19 DIAGNOSIS — L905 Scar conditions and fibrosis of skin: Secondary | ICD-10-CM | POA: Diagnosis not present

## 2017-06-25 DIAGNOSIS — I10 Essential (primary) hypertension: Secondary | ICD-10-CM | POA: Diagnosis not present

## 2017-06-25 DIAGNOSIS — R0609 Other forms of dyspnea: Secondary | ICD-10-CM | POA: Diagnosis not present

## 2017-06-25 DIAGNOSIS — R1313 Dysphagia, pharyngeal phase: Secondary | ICD-10-CM | POA: Diagnosis not present

## 2017-06-25 DIAGNOSIS — I4891 Unspecified atrial fibrillation: Secondary | ICD-10-CM | POA: Diagnosis not present

## 2017-06-28 DIAGNOSIS — L080 Pyoderma: Secondary | ICD-10-CM | POA: Diagnosis not present

## 2017-07-04 DIAGNOSIS — I10 Essential (primary) hypertension: Secondary | ICD-10-CM | POA: Diagnosis not present

## 2017-07-04 DIAGNOSIS — Z79899 Other long term (current) drug therapy: Secondary | ICD-10-CM | POA: Diagnosis not present

## 2017-07-04 DIAGNOSIS — R600 Localized edema: Secondary | ICD-10-CM | POA: Diagnosis not present

## 2017-07-04 DIAGNOSIS — I4891 Unspecified atrial fibrillation: Secondary | ICD-10-CM | POA: Diagnosis not present

## 2017-07-10 DIAGNOSIS — L910 Hypertrophic scar: Secondary | ICD-10-CM | POA: Diagnosis not present

## 2017-07-10 DIAGNOSIS — L98491 Non-pressure chronic ulcer of skin of other sites limited to breakdown of skin: Secondary | ICD-10-CM | POA: Diagnosis not present

## 2017-09-04 DIAGNOSIS — H401131 Primary open-angle glaucoma, bilateral, mild stage: Secondary | ICD-10-CM | POA: Diagnosis not present

## 2017-09-04 DIAGNOSIS — H26492 Other secondary cataract, left eye: Secondary | ICD-10-CM | POA: Diagnosis not present

## 2017-09-11 DIAGNOSIS — C61 Malignant neoplasm of prostate: Secondary | ICD-10-CM | POA: Diagnosis not present

## 2017-09-19 DIAGNOSIS — C61 Malignant neoplasm of prostate: Secondary | ICD-10-CM | POA: Diagnosis not present

## 2017-09-23 DIAGNOSIS — N281 Cyst of kidney, acquired: Secondary | ICD-10-CM | POA: Diagnosis not present

## 2017-09-23 DIAGNOSIS — R310 Gross hematuria: Secondary | ICD-10-CM | POA: Diagnosis not present

## 2017-09-23 DIAGNOSIS — C61 Malignant neoplasm of prostate: Secondary | ICD-10-CM | POA: Diagnosis not present

## 2017-09-25 ENCOUNTER — Other Ambulatory Visit: Payer: Self-pay | Admitting: Urology

## 2017-09-25 DIAGNOSIS — C61 Malignant neoplasm of prostate: Secondary | ICD-10-CM

## 2017-09-26 DIAGNOSIS — H26492 Other secondary cataract, left eye: Secondary | ICD-10-CM | POA: Diagnosis not present

## 2017-10-14 ENCOUNTER — Encounter (HOSPITAL_COMMUNITY)
Admission: RE | Admit: 2017-10-14 | Discharge: 2017-10-14 | Disposition: A | Discharge status: Home / Self Care | Payer: Medicare HMO | Source: Ambulatory Visit | Attending: Urology | Admitting: Urology

## 2017-10-14 DIAGNOSIS — C61 Malignant neoplasm of prostate: Secondary | ICD-10-CM | POA: Diagnosis not present

## 2017-10-14 DIAGNOSIS — K5730 Diverticulosis of large intestine without perforation or abscess without bleeding: Secondary | ICD-10-CM | POA: Diagnosis not present

## 2017-10-14 MED ORDER — TECHNETIUM TC 99M MEDRONATE IV KIT
25.0000 | PACK | Freq: Once | INTRAVENOUS | Status: AC | PRN
  Administered 2017-10-14: 21.8 via INTRAVENOUS

## 2017-10-22 DIAGNOSIS — C775 Secondary and unspecified malignant neoplasm of intrapelvic lymph nodes: Secondary | ICD-10-CM | POA: Diagnosis not present

## 2017-10-22 DIAGNOSIS — C61 Malignant neoplasm of prostate: Secondary | ICD-10-CM | POA: Diagnosis not present

## 2017-10-22 DIAGNOSIS — C7951 Secondary malignant neoplasm of bone: Secondary | ICD-10-CM | POA: Diagnosis not present

## 2017-11-12 DIAGNOSIS — C61 Malignant neoplasm of prostate: Secondary | ICD-10-CM | POA: Diagnosis not present

## 2017-11-12 DIAGNOSIS — Z5111 Encounter for antineoplastic chemotherapy: Secondary | ICD-10-CM | POA: Diagnosis not present

## 2017-12-12 DIAGNOSIS — E1142 Type 2 diabetes mellitus with diabetic polyneuropathy: Secondary | ICD-10-CM | POA: Diagnosis not present

## 2017-12-12 DIAGNOSIS — C61 Malignant neoplasm of prostate: Secondary | ICD-10-CM | POA: Diagnosis not present

## 2017-12-12 DIAGNOSIS — C7951 Secondary malignant neoplasm of bone: Secondary | ICD-10-CM | POA: Diagnosis not present

## 2017-12-12 DIAGNOSIS — Z7984 Long term (current) use of oral hypoglycemic drugs: Secondary | ICD-10-CM | POA: Diagnosis not present

## 2017-12-12 DIAGNOSIS — I4891 Unspecified atrial fibrillation: Secondary | ICD-10-CM | POA: Diagnosis not present

## 2017-12-12 DIAGNOSIS — I10 Essential (primary) hypertension: Secondary | ICD-10-CM | POA: Diagnosis not present

## 2018-01-14 DIAGNOSIS — C61 Malignant neoplasm of prostate: Secondary | ICD-10-CM | POA: Diagnosis not present

## 2018-01-21 DIAGNOSIS — C775 Secondary and unspecified malignant neoplasm of intrapelvic lymph nodes: Secondary | ICD-10-CM | POA: Diagnosis not present

## 2018-01-21 DIAGNOSIS — C7951 Secondary malignant neoplasm of bone: Secondary | ICD-10-CM | POA: Diagnosis not present

## 2018-01-21 DIAGNOSIS — R310 Gross hematuria: Secondary | ICD-10-CM | POA: Diagnosis not present

## 2018-01-21 DIAGNOSIS — N281 Cyst of kidney, acquired: Secondary | ICD-10-CM | POA: Diagnosis not present

## 2018-01-21 DIAGNOSIS — C61 Malignant neoplasm of prostate: Secondary | ICD-10-CM | POA: Diagnosis not present

## 2018-04-14 DIAGNOSIS — C61 Malignant neoplasm of prostate: Secondary | ICD-10-CM | POA: Diagnosis not present

## 2018-04-24 DIAGNOSIS — C61 Malignant neoplasm of prostate: Secondary | ICD-10-CM | POA: Diagnosis not present

## 2018-04-24 DIAGNOSIS — C7951 Secondary malignant neoplasm of bone: Secondary | ICD-10-CM | POA: Diagnosis not present

## 2018-04-24 DIAGNOSIS — C775 Secondary and unspecified malignant neoplasm of intrapelvic lymph nodes: Secondary | ICD-10-CM | POA: Diagnosis not present

## 2018-05-02 DIAGNOSIS — Z961 Presence of intraocular lens: Secondary | ICD-10-CM | POA: Diagnosis not present

## 2018-05-02 DIAGNOSIS — D3132 Benign neoplasm of left choroid: Secondary | ICD-10-CM | POA: Diagnosis not present

## 2018-05-02 DIAGNOSIS — H401132 Primary open-angle glaucoma, bilateral, moderate stage: Secondary | ICD-10-CM | POA: Diagnosis not present

## 2018-05-20 DIAGNOSIS — C61 Malignant neoplasm of prostate: Secondary | ICD-10-CM | POA: Diagnosis not present

## 2018-05-20 DIAGNOSIS — Z5111 Encounter for antineoplastic chemotherapy: Secondary | ICD-10-CM | POA: Diagnosis not present

## 2018-06-19 DIAGNOSIS — Z0000 Encounter for general adult medical examination without abnormal findings: Secondary | ICD-10-CM | POA: Diagnosis not present

## 2018-06-19 DIAGNOSIS — I10 Essential (primary) hypertension: Secondary | ICD-10-CM | POA: Diagnosis not present

## 2018-06-19 DIAGNOSIS — Z79899 Other long term (current) drug therapy: Secondary | ICD-10-CM | POA: Diagnosis not present

## 2018-06-19 DIAGNOSIS — D692 Other nonthrombocytopenic purpura: Secondary | ICD-10-CM | POA: Diagnosis not present

## 2018-06-19 DIAGNOSIS — K219 Gastro-esophageal reflux disease without esophagitis: Secondary | ICD-10-CM | POA: Diagnosis not present

## 2018-06-19 DIAGNOSIS — Z1389 Encounter for screening for other disorder: Secondary | ICD-10-CM | POA: Diagnosis not present

## 2018-06-19 DIAGNOSIS — C7951 Secondary malignant neoplasm of bone: Secondary | ICD-10-CM | POA: Diagnosis not present

## 2018-06-19 DIAGNOSIS — E1142 Type 2 diabetes mellitus with diabetic polyneuropathy: Secondary | ICD-10-CM | POA: Diagnosis not present

## 2018-06-19 DIAGNOSIS — C61 Malignant neoplasm of prostate: Secondary | ICD-10-CM | POA: Diagnosis not present

## 2018-06-19 DIAGNOSIS — I4891 Unspecified atrial fibrillation: Secondary | ICD-10-CM | POA: Diagnosis not present

## 2018-10-21 DIAGNOSIS — C61 Malignant neoplasm of prostate: Secondary | ICD-10-CM | POA: Diagnosis not present

## 2018-11-06 DIAGNOSIS — I10 Essential (primary) hypertension: Secondary | ICD-10-CM | POA: Diagnosis not present

## 2018-11-06 DIAGNOSIS — E1142 Type 2 diabetes mellitus with diabetic polyneuropathy: Secondary | ICD-10-CM | POA: Diagnosis not present

## 2018-11-06 DIAGNOSIS — L0292 Furuncle, unspecified: Secondary | ICD-10-CM | POA: Diagnosis not present

## 2018-11-13 DIAGNOSIS — L02212 Cutaneous abscess of back [any part, except buttock]: Secondary | ICD-10-CM | POA: Diagnosis not present

## 2018-11-13 DIAGNOSIS — L089 Local infection of the skin and subcutaneous tissue, unspecified: Secondary | ICD-10-CM | POA: Diagnosis not present

## 2018-11-13 DIAGNOSIS — L723 Sebaceous cyst: Secondary | ICD-10-CM | POA: Diagnosis not present

## 2018-11-13 DIAGNOSIS — L0292 Furuncle, unspecified: Secondary | ICD-10-CM | POA: Diagnosis not present

## 2018-11-18 DIAGNOSIS — C61 Malignant neoplasm of prostate: Secondary | ICD-10-CM | POA: Diagnosis not present

## 2018-11-18 DIAGNOSIS — R310 Gross hematuria: Secondary | ICD-10-CM | POA: Diagnosis not present

## 2018-11-18 DIAGNOSIS — C7951 Secondary malignant neoplasm of bone: Secondary | ICD-10-CM | POA: Diagnosis not present

## 2018-11-19 DIAGNOSIS — L0292 Furuncle, unspecified: Secondary | ICD-10-CM | POA: Diagnosis not present

## 2018-11-19 DIAGNOSIS — I10 Essential (primary) hypertension: Secondary | ICD-10-CM | POA: Diagnosis not present

## 2018-11-19 DIAGNOSIS — I4891 Unspecified atrial fibrillation: Secondary | ICD-10-CM | POA: Diagnosis not present

## 2018-11-19 DIAGNOSIS — J3089 Other allergic rhinitis: Secondary | ICD-10-CM | POA: Diagnosis not present

## 2018-11-24 DIAGNOSIS — E119 Type 2 diabetes mellitus without complications: Secondary | ICD-10-CM | POA: Diagnosis not present

## 2018-11-24 DIAGNOSIS — Z961 Presence of intraocular lens: Secondary | ICD-10-CM | POA: Diagnosis not present

## 2018-11-24 DIAGNOSIS — D3132 Benign neoplasm of left choroid: Secondary | ICD-10-CM | POA: Diagnosis not present

## 2018-12-01 DIAGNOSIS — Z79899 Other long term (current) drug therapy: Secondary | ICD-10-CM | POA: Diagnosis not present

## 2018-12-01 DIAGNOSIS — E1142 Type 2 diabetes mellitus with diabetic polyneuropathy: Secondary | ICD-10-CM | POA: Diagnosis not present

## 2018-12-01 DIAGNOSIS — I4891 Unspecified atrial fibrillation: Secondary | ICD-10-CM | POA: Diagnosis not present

## 2018-12-01 DIAGNOSIS — R5383 Other fatigue: Secondary | ICD-10-CM | POA: Diagnosis not present

## 2018-12-01 DIAGNOSIS — I10 Essential (primary) hypertension: Secondary | ICD-10-CM | POA: Diagnosis not present

## 2018-12-12 ENCOUNTER — Emergency Department (HOSPITAL_COMMUNITY): Payer: Medicare HMO

## 2018-12-12 ENCOUNTER — Encounter (HOSPITAL_COMMUNITY): Payer: Self-pay | Admitting: Emergency Medicine

## 2018-12-12 ENCOUNTER — Observation Stay (HOSPITAL_COMMUNITY)
Admission: EM | Admit: 2018-12-12 | Discharge: 2018-12-15 | Disposition: A | Discharge status: Home / Self Care | Payer: Medicare HMO | Attending: Internal Medicine | Admitting: Internal Medicine

## 2018-12-12 DIAGNOSIS — K219 Gastro-esophageal reflux disease without esophagitis: Secondary | ICD-10-CM | POA: Diagnosis not present

## 2018-12-12 DIAGNOSIS — M25552 Pain in left hip: Secondary | ICD-10-CM | POA: Diagnosis not present

## 2018-12-12 DIAGNOSIS — I82401 Acute embolism and thrombosis of unspecified deep veins of right lower extremity: Secondary | ICD-10-CM | POA: Diagnosis present

## 2018-12-12 DIAGNOSIS — I480 Paroxysmal atrial fibrillation: Secondary | ICD-10-CM | POA: Insufficient documentation

## 2018-12-12 DIAGNOSIS — R609 Edema, unspecified: Secondary | ICD-10-CM | POA: Diagnosis not present

## 2018-12-12 DIAGNOSIS — N390 Urinary tract infection, site not specified: Secondary | ICD-10-CM | POA: Diagnosis not present

## 2018-12-12 DIAGNOSIS — Z7901 Long term (current) use of anticoagulants: Secondary | ICD-10-CM | POA: Diagnosis not present

## 2018-12-12 DIAGNOSIS — E119 Type 2 diabetes mellitus without complications: Secondary | ICD-10-CM | POA: Diagnosis not present

## 2018-12-12 DIAGNOSIS — E860 Dehydration: Secondary | ICD-10-CM | POA: Insufficient documentation

## 2018-12-12 DIAGNOSIS — R531 Weakness: Secondary | ICD-10-CM | POA: Diagnosis not present

## 2018-12-12 DIAGNOSIS — Z79899 Other long term (current) drug therapy: Secondary | ICD-10-CM | POA: Insufficient documentation

## 2018-12-12 DIAGNOSIS — I2692 Saddle embolus of pulmonary artery without acute cor pulmonale: Secondary | ICD-10-CM | POA: Diagnosis not present

## 2018-12-12 DIAGNOSIS — I10 Essential (primary) hypertension: Secondary | ICD-10-CM | POA: Diagnosis not present

## 2018-12-12 DIAGNOSIS — I4891 Unspecified atrial fibrillation: Secondary | ICD-10-CM | POA: Diagnosis present

## 2018-12-12 DIAGNOSIS — M545 Low back pain: Secondary | ICD-10-CM | POA: Diagnosis not present

## 2018-12-12 DIAGNOSIS — R29898 Other symptoms and signs involving the musculoskeletal system: Secondary | ICD-10-CM

## 2018-12-12 LAB — COMPREHENSIVE METABOLIC PANEL
ALT: 12 U/L (ref 0–44)
AST: 20 U/L (ref 15–41)
Albumin: 3.7 g/dL (ref 3.5–5.0)
Alkaline Phosphatase: 67 U/L (ref 38–126)
Anion gap: 7 (ref 5–15)
BUN: 15 mg/dL (ref 8–23)
CO2: 26 mmol/L (ref 22–32)
Calcium: 9 mg/dL (ref 8.9–10.3)
Chloride: 103 mmol/L (ref 98–111)
Creatinine, Ser: 0.8 mg/dL (ref 0.61–1.24)
GFR calc Af Amer: >60 mL/min (ref >60)
GFR calc non Af Amer: >60 mL/min (ref >60)
Glucose, Bld: 152 mg/dL — ABNORMAL HIGH (ref 70–99)
Potassium: 4.2 mmol/L (ref 3.5–5.1)
Sodium: 136 mmol/L (ref 135–145)
Total Bilirubin: 0.5 mg/dL (ref 0.3–1.2)
Total Protein: 7 g/dL (ref 6.5–8.1)

## 2018-12-12 LAB — URINALYSIS, ROUTINE W REFLEX MICROSCOPIC
Bilirubin Urine: NEGATIVE
Glucose, UA: NEGATIVE mg/dL
Hgb urine dipstick: NEGATIVE
KETONES UR: NEGATIVE mg/dL
Nitrite: NEGATIVE
PH: 7 (ref 5.0–8.0)
Protein, ur: NEGATIVE mg/dL
Specific Gravity, Urine: 1.006 (ref 1.005–1.030)
WBC, UA: >50 WBC/hpf — ABNORMAL HIGH (ref 0–5)

## 2018-12-12 LAB — I-STAT TROPONIN, ED: Troponin i, poc: 0.03 ng/mL (ref 0.00–0.08)

## 2018-12-12 LAB — CBC WITH DIFFERENTIAL/PLATELET
ABS IMMATURE GRANULOCYTES: 0.03 10*3/uL (ref 0.00–0.07)
Basophils Absolute: 0 10*3/uL (ref 0.0–0.1)
Basophils Relative: 0 %
Eosinophils Absolute: 0.1 10*3/uL (ref 0.0–0.5)
Eosinophils Relative: 1 %
HCT: 40.3 % (ref 39.0–52.0)
HEMOGLOBIN: 12.6 g/dL — AB (ref 13.0–17.0)
IMMATURE GRANULOCYTES: 0 %
LYMPHS PCT: 13 %
Lymphs Abs: 1 10*3/uL (ref 0.7–4.0)
MCH: 29.6 pg (ref 26.0–34.0)
MCHC: 31.3 g/dL (ref 30.0–36.0)
MCV: 94.6 fL (ref 80.0–100.0)
Monocytes Absolute: 0.6 10*3/uL (ref 0.1–1.0)
Monocytes Relative: 8 %
Neutro Abs: 6.4 10*3/uL (ref 1.7–7.7)
Neutrophils Relative %: 78 %
Platelets: 228 10*3/uL (ref 150–400)
RBC: 4.26 MIL/uL (ref 4.22–5.81)
RDW: 14.4 % (ref 11.5–15.5)
WBC: 8.2 10*3/uL (ref 4.0–10.5)
nRBC: 0 % (ref 0.0–0.2)

## 2018-12-12 LAB — CBG MONITORING, ED: Glucose-Capillary: 143 mg/dL — ABNORMAL HIGH (ref 70–99)

## 2018-12-12 MED ORDER — ACETAMINOPHEN 325 MG PO TABS: 650.0000 mg | ORAL_TABLET | Freq: Four times a day (QID) | ORAL | Status: DC | PRN

## 2018-12-12 MED ORDER — SODIUM CHLORIDE 0.9 % IV SOLN
1.0000 g | INTRAVENOUS | Status: DC
  Administered 2018-12-13 – 2018-12-14 (×2): 1 g via INTRAVENOUS
  Filled 2018-12-12: qty 10
  Filled 2018-12-12 (×2): qty 1

## 2018-12-12 MED ORDER — SODIUM CHLORIDE 0.9 % IV SOLN
1.0000 g | Freq: Once | INTRAVENOUS | Status: AC
  Administered 2018-12-12: 1 g via INTRAVENOUS
  Filled 2018-12-12: qty 10

## 2018-12-12 MED ORDER — INSULIN ASPART 100 UNIT/ML ~~LOC~~ SOLN
0.0000 [IU] | Freq: Three times a day (TID) | SUBCUTANEOUS | Status: DC
  Administered 2018-12-13: 2 [IU] via SUBCUTANEOUS
  Administered 2018-12-13 – 2018-12-14 (×2): 1 [IU] via SUBCUTANEOUS
  Administered 2018-12-14 – 2018-12-15 (×2): 2 [IU] via SUBCUTANEOUS
  Administered 2018-12-15: 1 [IU] via SUBCUTANEOUS

## 2018-12-12 MED ORDER — RIVAROXABAN 20 MG PO TABS
20.0000 mg | ORAL_TABLET | Freq: Every day | ORAL | Status: DC
  Administered 2018-12-13 – 2018-12-15 (×4): 20 mg via ORAL
  Filled 2018-12-12 (×5): qty 1

## 2018-12-12 MED ORDER — SODIUM CHLORIDE 0.9 % IV SOLN
INTRAVENOUS | Status: AC
  Administered 2018-12-12 – 2018-12-13 (×2): via INTRAVENOUS

## 2018-12-12 MED ORDER — ACETAMINOPHEN 650 MG RE SUPP: 650.0000 mg | Freq: Four times a day (QID) | RECTAL | Status: DC | PRN

## 2018-12-12 MED ORDER — PANTOPRAZOLE SODIUM 40 MG PO TBEC
40.0000 mg | DELAYED_RELEASE_TABLET | Freq: Every day | ORAL | Status: DC
  Administered 2018-12-13 – 2018-12-15 (×3): 40 mg via ORAL
  Filled 2018-12-12: qty 1

## 2018-12-12 NOTE — ED Notes (Signed)
Patient transported to X-ray 

## 2018-12-12 NOTE — ED Notes (Signed)
Patient transported to x-ray. ?

## 2018-12-12 NOTE — ED Notes (Signed)
This RN attempted to collect orthostatic vital signs for pt.  Pt unable to tolerate standing long enough to collect standing BP.  Pt weak in legs, legs shaking.  Pt placed back in bed, side rails up, call bell and urinal within reach.

## 2018-12-12 NOTE — ED Provider Notes (Signed)
[facility] DEPT Provider Note   CSN: [id number] Arrival date & time: 12/12/18  1648    History   Chief Complaint Chief Complaint  Patient presents with  . Weakness    HPI Matthew Velez is a 85 y.o. male.     The history is provided by the patient, medical records and the EMS personnel. No language interpreter was used.  Weakness   Matthew Velez is a 85 y.o. male who presents to the Emergency Department complaining of weakness. He presents to the emergency department by EMS from [facility] home assisted living for evaluation of two weeks of progressive weakness. At baseline he is able to ambulate quite well but over the last two weeks he has experienced progressive weakness and difficulty on ambulation. He states he becomes very fatigued very quickly when standing and has to sit down. He almost falls at times. He denies any associated headache, chest pain, shortness of breath, abdominal pain, nausea, vomiting. He has chronic numbness in his legs, which is unchanged from baseline. He also reports progressive left hip pain. He saw his PCP two weeks ago, who decreased his Lasix to one pill daily from two pills daily due to low blood pressures. He does have a history of DVT and is currently anticoagulated. Past Medical History:  Diagnosis Date  . Atrial fibrillation, transient 02/19/2013  . Benign hypertension 02/19/2013  . Diastolic dysfunction 01/01/7352  . DM type 2 (diabetes mellitus, type 2) ([place]) 02/19/2013  . Prostate cancer ([place]) 02/19/2013  . Right leg DVT ([place]) 02/19/2013  . 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 UTI (urinary tract infection) 12/12/2018  . 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 Atrial fibrillation, transient 02/19/2013  . Right leg DVT ([place]) 02/19/2013  . DM type 2 (diabetes mellitus, type 2) ([place]) 02/19/2013  . Prostate cancer ([place]) 02/19/2013  . Benign hypertension  02/19/2013  . Diastolic dysfunction 29/92/4268    History reviewed. No pertinent surgical history.      Home Medications    Prior to Admission medications   Medication Sig Start Date End Date Taking? Authorizing Provider  Cranberry 300 MG tablet Take 300 mg by mouth daily.   Yes [provider]  furosemide (LASIX) 40 MG tablet Take 40 mg by mouth daily.   Yes [provider]  glipiZIDE (GLUCOTROL) 5 MG tablet Take 5 mg by mouth daily.    Yes [provider]  Glucosamine Sulfate 500 MG TABS Take 1 tablet by mouth daily.   Yes [provider]  lisinopril (PRINIVIL,ZESTRIL) 10 MG tablet Take 10 mg by mouth daily.   Yes [provider]  omeprazole (PRILOSEC) 20 MG capsule Take 20 mg by mouth 2 (two) times daily.    Yes [provider]  Rivaroxaban (XARELTO) 20 MG TABS Take 1 tablet (20 mg total) by mouth daily. With lunch (with food) Patient taking differently: Take 20 mg by mouth every other day.  02/26/13  Yes Rai, Ripudeep K, MD  Rivaroxaban (XARELTO) 15 MG TABS tablet Take 1 tablet (15 mg total) by mouth 2 (two) times daily. For 20 days, then start 20mg  daily dose 02/26/13   Rai, Vernelle Emerald, MD    Family History Family History  Problem Relation Age of Onset  . Colon cancer Mother   . Stroke Father     Social History Social History   Tobacco Use  . Smoking status:  Never Smoker  Substance Use Topics  . Alcohol use: Not on file  . Drug use: Not on file     Allergies   Chocolate; Fish allergy; Other; and Iodine   Review of Systems Review of Systems  Neurological: Positive for weakness.  All other systems reviewed and are negative.    Physical Exam Updated Vital Signs BP (!) 151/91   Pulse 86   Temp 98.3 F (36.8 C) (Oral)   Resp 12   SpO2 100%   Physical Exam Vitals signs and nursing note reviewed.  Constitutional:      Appearance: He is well-developed.  HENT:     Head: Normocephalic and atraumatic.   Cardiovascular:     Rate and Rhythm: Normal rate and regular rhythm.     Heart sounds: No murmur.  Pulmonary:     Effort: Pulmonary effort is normal. No respiratory distress.     Breath sounds: Normal breath sounds.  Abdominal:     Palpations: Abdomen is soft.     Tenderness: There is no abdominal tenderness. There is no guarding or rebound.  Musculoskeletal:        General: No tenderness.     Comments: 1 to 2+ pitting edema to bilateral lower extremities. 2+ DP pulses bilaterally.  Skin:    General: Skin is warm and dry.  Neurological:     Mental Status: He is alert and oriented to person, place, and time.     Comments: Hard of hearing. Five out of five strength and bilateral upper extremities, 4+ out of five strength and bilateral lower extremities. Sensation to light touch intact in all four extremities.  Psychiatric:        Mood and Affect: Mood normal.        Behavior: Behavior normal.      ED Treatments / Results  Labs (all labs ordered are listed, but only abnormal results are displayed) Labs Reviewed  COMPREHENSIVE METABOLIC PANEL - Abnormal; Notable for the following components:      Result Value   Glucose, Bld 152 (*)    All other components within normal limits  CBC WITH DIFFERENTIAL/PLATELET - Abnormal; Notable for the following components:   Hemoglobin 12.6 (*)    All other components within normal limits  URINALYSIS, ROUTINE W REFLEX MICROSCOPIC - Abnormal; Notable for the following components:   APPearance HAZY (*)    Leukocytes,Ua LARGE (*)    WBC, UA >50 (*)    Bacteria, UA FEW (*)    All other components within normal limits  URINE CULTURE  I-STAT TROPONIN, ED    EKG EKG Interpretation  Date/Time:  Friday December 12 2018 17:14:28 EDT Ventricular Rate:  93 PR Interval:    QRS Duration: 92 QT Interval:  378 QTC Calculation: 471 R Axis:   30 Text Interpretation:  Atrial fibrillation Posterior infarct, old Confirmed by Quintella Reichert [phone]) on  12/12/2018 5:25:37 PM   Radiology Dg Chest 2 View  Result Date: 12/12/2018 CLINICAL DATA:  Generalized weakness. EXAM: CHEST - 2 VIEW COMPARISON:  11/21/2015 FINDINGS: 1727 hours. Low volume film. The cardio pericardial silhouette is enlarged. Chronic atelectasis or scarring at the left base is similar to prior. The lungs are clear without focal pneumonia, edema, pneumothorax or pleural effusion. The visualized bony structures of the thorax are intact. Telemetry leads overlie the chest. IMPRESSION: Stable.  No acute findings. Electronically Signed   By: Misty Stanley M.D.   On: 12/12/2018 17:36   Dg Lumbar Spine Complete  Result Date: 12/12/2018 CLINICAL DATA:  Generalized weakness.  Low back pain. EXAM: LUMBAR SPINE - COMPLETE 4+ VIEW COMPARISON:  None. FINDINGS: Bones are diffusely demineralized. No evidence for an acute fracture. No subluxation. Degenerative disc disease most advanced at L3-4 and L4-5. Facet osteoarthritis noted lower lumbar levels. IMPRESSION: Degenerative changes without acute bony abnormality. Electronically Signed   By: Misty Stanley M.D.   On: 12/12/2018 19:58   Dg Hip Unilat W Or Wo Pelvis 2-3 Views Left  Result Date: 12/12/2018 CLINICAL DATA:  Chronic intermittent weakness which has worsened over the last month. Left hip pain. No known injury. EXAM: DG HIP (WITH OR WITHOUT PELVIS) 2-3V LEFT COMPARISON:  None. FINDINGS: There is no evidence of hip fracture or dislocation. There is no evidence of arthropathy or other focal bone abnormality. IMPRESSION: Negative exam. Electronically Signed   By: Inge Rise M.D.   On: 12/12/2018 19:58    Procedures Procedures (including critical care time)  Medications Ordered in ED Medications  cefTRIAXone (ROCEPHIN) 1 g in sodium chloride 0.9 % 100 mL IVPB (has no administration in time range)  pantoprazole (PROTONIX) EC tablet 40 mg (has no administration in time range)  rivaroxaban (XARELTO) tablet 20 mg (has no administration  in time range)  acetaminophen (TYLENOL) tablet 650 mg (has no administration in time range)    Or  acetaminophen (TYLENOL) suppository 650 mg (has no administration in time range)  0.9 %  sodium chloride infusion ( Intravenous New Bag/Given 12/12/18 2134)  insulin aspart (novoLOG) injection 0-9 Units (has no administration in time range)  cefTRIAXone (ROCEPHIN) 1 g in sodium chloride 0.9 % 100 mL IVPB (0 g Intravenous Stopped 12/12/18 2126)     Initial Impression / Assessment and Plan / ED Course  I have reviewed the triage vital signs and the nursing notes.  Pertinent labs & imaging results that were available during my care of the patient were reviewed by me and considered in my medical decision making (see chart for details).        Patient with history of prostate cancer, a fib, DVT on Xarelto here for evaluation of weakness over the last two weeks. He is unable to stand or ambulate. He is non-toxic appearing. He is in rate controlled atrial fibrillation. No evidence of pneumonia or acute CHF. Urinalysis is consistent with urinary tract infection in setting of his symptoms. Will treat with antibiotics and plan to admit for observation. Patient updated of findings of studies and recommendation for admission and he is in agreement with treatment plan.  Final Clinical Impressions(s) / ED Diagnoses   Final diagnoses:  None    ED Discharge Orders    None       Quintella Reichert, MD 12/12/18 2144

## 2018-12-12 NOTE — ED Triage Notes (Signed)
Pt BIBA from [facility] with c/o generalized weakness "off and on for a long time" worse over the last month.  Pt states that when he gets up to stand, usually he can pause and then continue to walk but now his right foot drags behind him.  Pt reports recently having furosemide prescription decreased (one month ago) from "2 pills to one pill" d/t low blood pressure.   Pt AOx4, hard of hearing.

## 2018-12-12 NOTE — ED Notes (Signed)
Attempted to call report; nurse states she will call back in a few minutes.

## 2018-12-12 NOTE — ED Notes (Signed)
Bed: BA25 Expected date:  Expected time:  Means of arrival:  Comments: EMS-difficulty ambulating

## 2018-12-13 ENCOUNTER — Observation Stay (HOSPITAL_BASED_OUTPATIENT_CLINIC_OR_DEPARTMENT_OTHER): Payer: Medicare HMO

## 2018-12-13 ENCOUNTER — Other Ambulatory Visit: Payer: Self-pay

## 2018-12-13 DIAGNOSIS — N390 Urinary tract infection, site not specified: Secondary | ICD-10-CM

## 2018-12-13 DIAGNOSIS — I480 Paroxysmal atrial fibrillation: Secondary | ICD-10-CM

## 2018-12-13 DIAGNOSIS — R29898 Other symptoms and signs involving the musculoskeletal system: Secondary | ICD-10-CM

## 2018-12-13 DIAGNOSIS — I82409 Acute embolism and thrombosis of unspecified deep veins of unspecified lower extremity: Secondary | ICD-10-CM

## 2018-12-13 LAB — GLUCOSE, CAPILLARY
GLUCOSE-CAPILLARY: 198 mg/dL — AB (ref 70–99)
Glucose-Capillary: 144 mg/dL — ABNORMAL HIGH (ref 70–99)
Glucose-Capillary: 98 mg/dL (ref 70–99)
Glucose-Capillary: 142 mg/dL — ABNORMAL HIGH (ref 70–99)

## 2018-12-13 MED ORDER — HYDRALAZINE HCL 20 MG/ML IJ SOLN: 5.0000 mg | INTRAMUSCULAR | Status: DC | PRN

## 2018-12-13 MED ORDER — SODIUM CHLORIDE 0.9 % IV SOLN
INTRAVENOUS | Status: AC
  Administered 2018-12-13 – 2018-12-14 (×2): via INTRAVENOUS

## 2018-12-13 NOTE — Progress Notes (Signed)
I have seen and assessed the patient and agree with Dr. Mayme Genta assessment and plan.  Patient is a pleasant 85 year old gentleman history of paroxysmal A. fib, hypertension, type 2 diabetes, chronic diastolic CHF, history of right leg DVT and saddle PE, history of prostate cancer presented with lower extremity weakness and dysuria.  Work-up noted with urinalysis worrisome for UTI.  Urine cultures ordered and pending.  Patient placed on IV Rocephin.  PT/OT.  Lower extremity Dopplers done negative for DVT.  Supportive care.  No charge.

## 2018-12-13 NOTE — Progress Notes (Signed)
Occupational Therapy Treatment Patient Details Name: Matthew Velez MRN: [id number] DOB: 07-20-1919 Today's Date: 12/13/2018    History of present illness Pt admitted from [facility] 2* generalized weakness and dx with UTI.  Pt with hx of DM, a-fib, and saddle PE (14)   OT comments  Pt requiring +2 mod assist for functional transfers with walker at this time. He also requires +2 total assist with LB self care as he is not able to release walker to help with clothing management or hygiene. He was in independent living PTA but feel he will need SNF at d/c. Will benefit from continued OT to progress ADL independence for next venue.   Follow Up Recommendations  SNF;Supervision/Assistance - 24 hour    Equipment Recommendations  Other (comment)(defer to next venue)    Recommendations for Other Services      Precautions / Restrictions Precautions Precautions: Fall Restrictions Weight Bearing Restrictions: No       Mobility Bed Mobility Overal bed mobility: Needs Assistance Bed Mobility: Sit to Supine       Sit to supine: Min assist;Mod assist   General bed mobility comments: cues for sequence and assist to bring LEs up onto bed.  Transfers Overall transfer level: Needs assistance Equipment used: Rolling walker (2 wheeled) Transfers: Sit to/from Stand Sit to Stand: Mod assist;+2 physical assistance;+2 safety/equipment Stand pivot transfers: Mod assist;+2 safety/equipment;From elevated surface       General transfer comment: cues    Balance                                           ADL either performed or assessed with clinical judgement   ADL Overall ADL's : Needs assistance/impaired Eating/Feeding: Set up;Sitting   Grooming: Wash/dry face;Set up;Bed level   Upper Body Bathing: Sitting;Set up   Lower Body Bathing: Maximal assistance;+2 for safety/equipment;+2 for physical assistance;Sit to/from stand   Upper Body Dressing : Moderate  assistance;Sitting   Lower Body Dressing: +2 for safety/equipment;+2 for physical assistance;Maximal assistance;Sit to/from stand   Toilet Transfer: Moderate assistance;+2 for safety/equipment;+2 for physical assistance;Stand-pivot;RW   Toileting- Clothing Manipulation and Hygiene: +2 for safety/equipment;+2 for physical assistance;Total assistance         General ADL Comments: Pt up on [facility] with PT upon OT arrival. Assisted pt up from [facility] into standing and pt unable to release walker to help with toilet hygiene at this time. Pt required balance support and total assist for toilet hygiene in standing. Pt then pivoted back to EOB with +2 mod assist and walker with increased time and cues for walker and safety as well as hand placement.      Vision Patient Visual Report: No change from baseline     Perception     Praxis      Cognition Arousal/Alertness: Awake/alert Behavior During Therapy: WFL for tasks assessed/performed Overall Cognitive Status: Within Functional Limits for tasks assessed                                          Exercises     Shoulder Instructions       General Comments      Pertinent Vitals/ Pain          Home Living Family/patient expects to be discharged to:: Skilled nursing  facility                                        Prior Functioning/Environment Level of Independence: Independent with assistive device(s)        Comments: Until a few days prior to admit, pt ambulating with RW unassisted   Frequency  Min 2X/week        Progress Toward Goals  OT Goals(current goals can now be found in the care plan section)     Acute Rehab OT Goals Patient Stated Goal: walk OT Goal Formulation: With patient Time For Goal Achievement: 12/27/18 Potential to Achieve Goals: Good  Plan      Co-evaluation    PT/OT/SLP Co-Evaluation/Treatment: Yes Reason for Co-Treatment: For patient/therapist safety   OT goals  addressed during session: ADL's and self-care      AM-PAC OT "6 Clicks" Daily Activity     Outcome Measure   Help from another person eating meals?: A Little Help from another person taking care of personal grooming?: A Little Help from another person toileting, which includes using toliet, bedpan, or urinal?: A Lot Help from another person bathing (including washing, rinsing, drying)?: A Lot Help from another person to put on and taking off regular upper body clothing?: A Little Help from another person to put on and taking off regular lower body clothing?: Total 6 Click Score: 14    End of Session Equipment Utilized During Treatment: Rolling walker  OT Visit Diagnosis: Unsteadiness on feet (R26.81);Muscle weakness (generalized) (M62.81)   Activity Tolerance Patient tolerated treatment well   Patient Left in bed;with call bell/phone within reach   Nurse Communication          Time: 1610-9604 OT Time Calculation (min): 23 min  Charges: OT General Charges $OT Visit: 1 Visit OT Evaluation $OT Eval Low Complexity: [place] OTR/L Acute Rehabilitation [phone] office number    12/13/2018, 4:12 PM

## 2018-12-13 NOTE — H&P (Signed)
History and Physical    Matthew Velez XBM:[id number] DOB: 04-Nov-1918 DOA: 12/12/2018  PCP: Lajean Manes, MD Patient coming from: [place] [facility] assisted living  Chief Complaint: Weakness  HPI: Matthew Velez is a 85 y.o. male with medical history significant of paroxysmal atrial fibrillation, hypertension, type 2 diabetes, chronic diastolic congestive heart failure, right leg DVT, saddle PE, prostate cancer presenting to the hospital for evaluation of weakness.  Patient reports weakness in both of his legs for the past 2 weeks and difficulty ambulating.  States he is struggling even to get to his walker.  The nurse at his independent living facility is helping him go to the bathroom.  Denies having any fevers or chills.  Denies any back pain.  States his feet are chronically numb due to diabetic neuropathy.  Reports having intermittent, stabbing pain in his left hip when walking for the past 1 day.  Denies any falls or injury.  Denies any chest pain, shortness of breath, or cough.  Reports having dysuria.  No urinary frequency or urgency.  Review of Systems: As per HPI otherwise 10 point review of systems negative.  Past Medical History:  Diagnosis Date  . Atrial fibrillation, transient 02/19/2013  . Benign hypertension 02/19/2013  . Diastolic dysfunction 0/27/2536  . DM type 2 (diabetes mellitus, type 2) ([place]) 02/19/2013  . Prostate cancer ([place]) 02/19/2013  . Right leg DVT ([place]) 02/19/2013  . Saddle pulmonary embolus ([place]) 02/19/2013    History reviewed. No pertinent surgical history.   reports that he has never smoked. He has never used smokeless tobacco. He reports previous alcohol use. He reports that he does not use drugs.  Allergies  Allergen Reactions  . Chocolate Shortness Of Breath  . Fish Allergy Anaphylaxis  . Other Anaphylaxis    Flu shot causes Anaphylaxis  . Iodine Rash    Family History  Problem Relation Age of Onset  . Colon cancer Mother   . Stroke Father      Prior to Admission medications   Medication Sig Start Date End Date Taking? Authorizing Provider  Cranberry 300 MG tablet Take 300 mg by mouth daily.   Yes [provider]  furosemide (LASIX) 40 MG tablet Take 40 mg by mouth daily.   Yes [provider]  glipiZIDE (GLUCOTROL) 5 MG tablet Take 5 mg by mouth daily.    Yes [provider]  Glucosamine Sulfate 500 MG TABS Take 1 tablet by mouth daily.   Yes [provider]  lisinopril (PRINIVIL,ZESTRIL) 10 MG tablet Take 10 mg by mouth daily.   Yes [provider]  omeprazole (PRILOSEC) 20 MG capsule Take 20 mg by mouth 2 (two) times daily.    Yes [provider]  Rivaroxaban (XARELTO) 20 MG TABS Take 1 tablet (20 mg total) by mouth daily. With lunch (with food) Patient taking differently: Take 20 mg by mouth every other day.  02/26/13  Yes Rai, Ripudeep K, MD  Rivaroxaban (XARELTO) 15 MG TABS tablet Take 1 tablet (15 mg total) by mouth 2 (two) times daily. For 20 days, then start 20mg  daily dose 02/26/13   Mendel Corning, MD    Physical Exam: Vitals:   12/12/18 2105 12/12/18 2200 12/12/18 2319 12/13/18 0008  BP: (!) 151/91 (!) 124/91 137/74   Pulse: 86 87 87   Resp: 12 18 17    Temp:   97.7 F (36.5 C)   TempSrc:   Oral   SpO2: 100% 98% 100%   Weight:  76 kg  Height:    5\' 5"  (1.651 m)    Physical Exam  Constitutional: He is oriented to person, place, and time. No distress.  HENT:  Head: Normocephalic.  Mouth/Throat: Oropharynx is clear and moist.  Eyes: Pupils are equal, round, and reactive to light. Right eye exhibits no discharge. Left eye exhibits no discharge.  Neck: Neck supple.  Cardiovascular: Normal rate, regular rhythm and intact distal pulses.  Pulmonary/Chest: Effort normal. No respiratory distress. He has no wheezes.  Abdominal: Soft. Bowel sounds are normal. He exhibits no distension. There is no abdominal tenderness. There is no guarding.  Musculoskeletal:         General: Edema present.     Comments: Right lower extremity appears erythematous and edematous compared to the contralateral side.  Neurological: He is alert and oriented to person, place, and time.  Strength 5 out of 5 in bilateral upper and lower extremities.  Sensation to light touch intact throughout.  Skin: Skin is warm and dry. He is not diaphoretic.     Labs on Admission: I have personally reviewed following labs and imaging studies  CBC: Recent Labs  Lab 12/12/18 1757  WBC 8.2  NEUTROABS 6.4  HGB 12.6*  HCT 40.3  MCV 94.6  PLT 431   Basic Metabolic Panel: Recent Labs  Lab 12/12/18 1757  NA 136  K 4.2  CL 103  CO2 26  GLUCOSE 152*  BUN 15  CREATININE 0.80  CALCIUM 9.0   GFR: Estimated Creatinine Clearance: 50.2 mL/min (by C-G formula based on SCr of 0.8 mg/dL). Liver Function Tests: Recent Labs  Lab 12/12/18 1757  AST 20  ALT 12  ALKPHOS 67  BILITOT 0.5  PROT 7.0  ALBUMIN 3.7   No results for input(s): LIPASE, AMYLASE in the last 168 hours. No results for input(s): AMMONIA in the last 168 hours. Coagulation Profile: No results for input(s): INR, PROTIME in the last 168 hours. Cardiac Enzymes: No results for input(s): CKTOTAL, CKMB, CKMBINDEX, TROPONINI in the last 168 hours. BNP (last 3 results) No results for input(s): PROBNP in the last 8760 hours. HbA1C: No results for input(s): HGBA1C in the last 72 hours. CBG: Recent Labs  Lab 12/12/18 2221  GLUCAP 143*   Lipid Profile: No results for input(s): CHOL, HDL, LDLCALC, TRIG, CHOLHDL, LDLDIRECT in the last 72 hours. Thyroid Function Tests: No results for input(s): TSH, T4TOTAL, FREET4, T3FREE, THYROIDAB in the last 72 hours. Anemia Panel: No results for input(s): VITAMINB12, FOLATE, FERRITIN, TIBC, IRON, RETICCTPCT in the last 72 hours. Urine analysis:    Component Value Date/Time   COLORURINE YELLOW 12/12/2018 1911   APPEARANCEUR HAZY (A) 12/12/2018 1911   LABSPEC 1.006 12/12/2018 1911    PHURINE 7.0 12/12/2018 1911   GLUCOSEU NEGATIVE 12/12/2018 1911   HGBUR NEGATIVE 12/12/2018 1911   BILIRUBINUR NEGATIVE 12/12/2018 1911   KETONESUR NEGATIVE 12/12/2018 1911   PROTEINUR NEGATIVE 12/12/2018 1911   UROBILINOGEN 4.0 (H) 08/02/2008 1114   NITRITE NEGATIVE 12/12/2018 1911   LEUKOCYTESUR LARGE (A) 12/12/2018 1911    Radiological Exams on Admission: Dg Chest 2 View  Result Date: 12/12/2018 CLINICAL DATA:  Generalized weakness. EXAM: CHEST - 2 VIEW COMPARISON:  11/21/2015 FINDINGS: 1727 hours. Low volume film. The cardio pericardial silhouette is enlarged. Chronic atelectasis or scarring at the left base is similar to prior. The lungs are clear without focal pneumonia, edema, pneumothorax or pleural effusion. The visualized bony structures of the thorax are intact. Telemetry leads overlie the chest. IMPRESSION:  Stable.  No acute findings. Electronically Signed   By: Misty Stanley M.D.   On: 12/12/2018 17:36   Dg Lumbar Spine Complete  Result Date: 12/12/2018 CLINICAL DATA:  Generalized weakness.  Low back pain. EXAM: LUMBAR SPINE - COMPLETE 4+ VIEW COMPARISON:  None. FINDINGS: Bones are diffusely demineralized. No evidence for an acute fracture. No subluxation. Degenerative disc disease most advanced at L3-4 and L4-5. Facet osteoarthritis noted lower lumbar levels. IMPRESSION: Degenerative changes without acute bony abnormality. Electronically Signed   By: Misty Stanley M.D.   On: 12/12/2018 19:58   Dg Hip Unilat W Or Wo Pelvis 2-3 Views Left  Result Date: 12/12/2018 CLINICAL DATA:  Chronic intermittent weakness which has worsened over the last month. Left hip pain. No known injury. EXAM: DG HIP (WITH OR WITHOUT PELVIS) 2-3V LEFT COMPARISON:  None. FINDINGS: There is no evidence of hip fracture or dislocation. There is no evidence of arthropathy or other focal bone abnormality. IMPRESSION: Negative exam. Electronically Signed   By: Inge Rise M.D.   On: 12/12/2018 19:58     EKG: Independently reviewed.  A. fib (heart rate 93), nonspecific T wave abnormality.  Assessment/Plan Principal Problem:   UTI (urinary tract infection) Active Problems:   Saddle pulmonary embolus (HCC)   Atrial fibrillation, transient   Right leg DVT (HCC)   Bilateral leg weakness   UTI Afebrile and hemodynamically stable.  No leukocytosis.  UA with large amount of leukocytes, greater than 50 WBCs, and few bacteria on microscopic examination. -Ceftriaxone -Urine culture pending  Bilateral lower extremity weakness No focal weakness on exam.  Afebrile and no leukocytosis.  Not complaining of back pain.  Lumbar x-ray negative for acute finding.  Patient did complain of intermittent left hip pain, however, x-ray negative. -PT/OT evaluation -Infection could also be contributing to his weakness.  Continue treatment for UTI as mentioned above.  History of right lower extremity DVT and saddle PE -Right lower extremity appears erythematous and edematous on exam.  Patient is on Xarelto, however, medication reconciliation suggests subtherapeutic dosing.  He is taking the medication every other day instead of daily. -Right lower extremity Doppler to rule out DVT -Continue Xarelto 20 mg daily  Paroxysmal atrial fibrillation -Currently rate controlled.  Continue Xarelto for anticoagulation.  Hypertension -Currently normotensive.  Hydralazine PRN.  Type 2 diabetes -Random blood glucose 152.   -Sliding scale insulin and CBG checks.  GERD -Continue PPI  DVT prophylaxis: Xarelto Code Status: Patient wishes to be DNR. Family Communication: No family available Disposition Plan: Anticipate discharge after clinical improvement Consults called: None Admission status: Observation  This chart was dictated using voice recognition software.  Despite best efforts to proofread, errors can occur which can change the documentation meaning.  Shela Leff MD [facility] Pager [phone]  If 7PM-7AM, please contact night-coverage [email or web address] Password [facility]  12/13/2018, 2:59 AM

## 2018-12-13 NOTE — TOC Initial Note (Addendum)
Transition of Care [facility]) - Initial/Assessment Note    Patient Details  Name: Matthew Velez MRN: [id number] Date of Birth: 1919-07-16  Transition of [facility]) CM/SW Contact:    Servando Snare, LCSW Phone Number: 12/13/2018, 3:46 PM  Clinical Narrative: Patient is from independent living at the [facility] [facility]. Patient ans spouse agreeable to SNF at dc.            Needs [employer] auth prior to dc.          Expected Discharge Plan: Skilled Nursing Facility Barriers to Discharge: Insurance Authorization, Continued Medical Work up   Patient Goals and CMS Choice Patient states their goals for this hospitalization and ongoing recovery are:: Return to independent living with spouse. CMS Medicare.gov Compare Post Acute Care list provided to:: (Patietn going to SNF on [address] as his independent living housing is. ) Choice offered to / list presented to : NA  Expected Discharge Plan and Services Expected Discharge Plan: [place] arrangements for the past 2 months: [place] Expected Discharge Date: (unknown)                        Prior Living Arrangements/Services Living arrangements for the past 2 months: [place] Lives with:: Spouse Patient language and need for interpreter reviewed:: No Do you feel safe going back to the place where you live?: Yes      Need for Family Participation in Patient Care: Yes (Comment) Care giver support system in place?: Yes (comment)   Criminal Activity/Legal Involvement Pertinent to Current Situation/Hospitalization: No - Comment as needed  Activities of Daily Living Home Assistive Devices/Equipment: Walker (specify type) ADL Screening (condition at time of admission) Patient's cognitive ability adequate to safely complete daily activities?: Yes Is the patient deaf or have difficulty hearing?: No Does the patient have difficulty seeing, even when wearing  glasses/contacts?: No Does the patient have difficulty concentrating, remembering, or making decisions?: Yes Patient able to express need for assistance with ADLs?: Yes Does the patient have difficulty dressing or bathing?: Yes Independently performs ADLs?: No Communication: Independent Dressing (OT): Needs assistance Is this a change from baseline?: Change from baseline, expected to last <3days Grooming: Needs assistance Is this a change from baseline?: Change from baseline, expected to last <3 days Feeding: Independent Bathing: Needs assistance Is this a change from baseline?: Change from baseline, expected to last <3 days Toileting: Needs assistance Is this a change from baseline?: Change from baseline, expected to last <3 days In/Out Bed: Needs assistance Is this a change from baseline?: Change from baseline, expected to last <3 days Walks in Home: Needs assistance Is this a change from baseline?: Change from baseline, expected to last <3 days Does the patient have difficulty walking or climbing stairs?: Yes Weakness of Legs: Both Weakness of Arms/Hands: Both  Permission Sought/Granted Permission sought to share information with : Facility Sport and exercise psychologist, Family Supports Permission granted to share information with : Yes, Verbal Permission Granted  Share Information with NAME: [name]  Permission granted to share info w AGENCY: [facility]  Permission granted to share info w Relationship: Spouse     Emotional Assessment Appearance:: Appears stated age Attitude/Demeanor/Rapport: Unable to Assess Affect (typically observed): Calm Orientation: : Oriented to Self, Oriented to Place, Oriented to  Time, Oriented to Situation Alcohol / Substance Use: Not Applicable Psych Involvement: No (comment)  Admission diagnosis:  UTI (urinary tract infection) [  N39.0] Patient Active Problem List   Diagnosis Date Noted  . Bilateral leg weakness 12/13/2018  . UTI (urinary tract  infection) 12/12/2018  . Saddle pulmonary embolus ([place]) 02/19/2013  . Atrial fibrillation, transient 02/19/2013  . Right leg DVT ([place]) 02/19/2013  . DM type 2 (diabetes mellitus, type 2) ([place]) 02/19/2013  . Prostate cancer ([place]) 02/19/2013  . Benign hypertension 02/19/2013  . Diastolic dysfunction 21/78/3754   PCP:  Lajean Manes, MD Pharmacy:   [facility], [place] [address] Phone: [phone] Fax: [phone]     Social Determinants of Health (SDOH) Interventions    Readmission Risk Interventions Readmission Risk Prevention Plan 12/13/2018  Post Dischage Appt Complete  Medication Screening Complete  Transportation Screening Complete  Some recent data might be hidden

## 2018-12-13 NOTE — NC FL2 (Signed)
[place] LEVEL OF CARE SCREENING TOOL     IDENTIFICATION  Patient Name: Matthew Velez Birthdate: [birth year] Sex: male Admission Date (Current Location): 12/12/2018  [facility] and [place] Number:  Herbalist and Address:  [facility],  [place], [place]      Provider Number: [id number]  Attending Physician Name and Address:  Eugenie Filler, MD  Relative Name and Phone Number:       Current Level of Care: Hospital Recommended Level of Care: [place] Prior Approval Number:    Date Approved/Denied:   PASRR Number: [id number]  Discharge Plan: SNF    Current Diagnoses: Patient Active Problem List   Diagnosis Date Noted  . Bilateral leg weakness 12/13/2018  . UTI (urinary tract infection) 12/12/2018  . Saddle pulmonary embolus ([place]) 02/19/2013  . Atrial fibrillation, transient 02/19/2013  . Right leg DVT ([place]) 02/19/2013  . DM type 2 (diabetes mellitus, type 2) ([place]) 02/19/2013  . Prostate cancer ([place]) 02/19/2013  . Benign hypertension 02/19/2013  . Diastolic dysfunction 69/67/8938    Orientation RESPIRATION BLADDER Height & Weight     Self, Time, Situation, Place  Normal Continent Weight: 167 lb 8.8 oz (76 kg) Height:  5\' 5"  (165.1 cm)  BEHAVIORAL SYMPTOMS/MOOD NEUROLOGICAL BOWEL NUTRITION STATUS      Continent Diet(See dc summary)  AMBULATORY STATUS COMMUNICATION OF NEEDS Skin   Extensive Assist Verbally Normal                       Personal Care Assistance Level of Assistance  Bathing, Feeding, Dressing Bathing Assistance: Limited assistance Feeding assistance: Independent Dressing Assistance: Limited assistance     Functional Limitations Info  Sight, Hearing, Speech Sight Info: Adequate Hearing Info: Impaired Speech Info: Adequate    SPECIAL CARE FACTORS FREQUENCY  PT (By licensed PT), OT (By licensed OT)     PT Frequency: 5x/week OT Frequency: 5x/week             Contractures Contractures Info: Not present    Additional Factors Info  Code Status, Allergies Code Status Info: DNR Allergies Info:  Chocolate, Fish Allergy, Other, Iodine           Current Medications (12/13/2018):  This is the current hospital active medication list Current Facility-Administered Medications  Medication Dose Route Frequency Provider Last Rate Last Dose  . acetaminophen (TYLENOL) tablet 650 mg  650 mg Oral Q6H PRN Shela Leff, MD       Or  . acetaminophen (TYLENOL) suppository 650 mg  650 mg Rectal Q6H PRN Shela Leff, MD      . cefTRIAXone (ROCEPHIN) 1 g in sodium chloride 0.9 % 100 mL IVPB  1 g Intravenous Q24H Shela Leff, MD      . hydrALAZINE (APRESOLINE) injection 5 mg  5 mg Intravenous Q4H PRN Shela Leff, MD      . insulin aspart (novoLOG) injection 0-9 Units  0-9 Units Subcutaneous TID WC Shela Leff, MD   2 Units at 12/13/18 1241  . pantoprazole (PROTONIX) EC tablet 40 mg  40 mg Oral Daily Shela Leff, MD   40 mg at 12/13/18 1109  . rivaroxaban (XARELTO) tablet 20 mg  20 mg Oral Q supper Shela Leff, MD   20 mg at 12/13/18 0001     Discharge Medications: Please see discharge summary for a list of discharge medications.  Relevant Imaging Results:  Relevant Lab Results:   Additional Information ssn: [id number]  Servando Snare, LCSW

## 2018-12-13 NOTE — Progress Notes (Signed)
Lower extremity venous duplex has been completed.   Preliminary results in CV Proc.   Abram Sander 12/13/2018 8:20 AM

## 2018-12-13 NOTE — Evaluation (Signed)
Physical Therapy Evaluation Patient Details Name: Matthew Velez MRN: [id number] DOB: May 05, 1919 Today's Date: 12/13/2018   History of Present Illness  Pt admitted from [facility] 2* generalized weakness and dx with UTI.  Pt with hx of DM, a-fib, and saddle PE (14)  Clinical Impression  Pt admitted as above and presenting with functional mobility limitations 2* generalized weakness, poor endurance and ambulatory balance deficits.  Pt would benefit from follow up rehab at SNF level to maximize IND and safety.    Follow Up Recommendations SNF    Equipment Recommendations  None recommended by PT    Recommendations for Other Services       Precautions / Restrictions Precautions Precautions: Fall Restrictions Weight Bearing Restrictions: No      Mobility  Bed Mobility Overal bed mobility: Needs Assistance Bed Mobility: Sit to Supine       Sit to supine: Min assist;Mod assist   General bed mobility comments: cues for sequence and assist to bring LEs up onto bed.  Transfers Overall transfer level: Needs assistance Equipment used: Rolling walker (2 wheeled) Transfers: Sit to/from Stand Sit to Stand: Mod assist;+2 physical assistance;+2 safety/equipment Stand pivot transfers: Mod assist;+2 safety/equipment;From elevated surface       General transfer comment: cues  Ambulation/Gait Ambulation/Gait assistance: Mod assist;+2 safety/equipment Gait Distance (Feet): 4 Feet Assistive device: Rolling walker (2 wheeled) Gait Pattern/deviations: Step-to pattern;Decreased step length - right;Decreased step length - left;Decreased stance time - right;Shuffle;Trunk flexed Gait velocity: decr   General Gait Details: cues for posture, position from RW and sequence; pt with difficulty advancing either LE (R <L)  Stairs            Wheelchair Mobility    Modified Rankin (Stroke Patients Only)       Balance Overall balance assessment: Needs assistance Sitting-balance  support: No upper extremity supported;Feet supported Sitting balance-Leahy Scale: Good     Standing balance support: Bilateral upper extremity supported Standing balance-Leahy Scale: Poor                               Pertinent Vitals/Pain Pain Assessment: No/denies pain    Home Living Family/patient expects to be discharged to:: Skilled nursing facility                      Prior Function Level of Independence: Independent with assistive device(s)         Comments: Until a few days prior to admit, pt ambulating with RW unassisted     Hand Dominance        Extremity/Trunk Assessment   Upper Extremity Assessment Upper Extremity Assessment: Generalized weakness    Lower Extremity Assessment Lower Extremity Assessment: Generalized weakness    Cervical / Trunk Assessment Cervical / Trunk Assessment: Kyphotic  Communication   Communication: HOH  Cognition Arousal/Alertness: Awake/alert Behavior During Therapy: WFL for tasks assessed/performed Overall Cognitive Status: Within Functional Limits for tasks assessed                                        General Comments      Exercises     Assessment/Plan    PT Assessment Patient needs continued PT services  PT Problem List Decreased strength;Decreased activity tolerance;Decreased balance;Decreased mobility;Decreased knowledge of use of DME       PT Treatment Interventions DME  instruction;Gait training;Functional mobility training;Therapeutic activities;Therapeutic exercise;Patient/family education    PT Goals (Current goals can be found in the Care Plan section)  Acute Rehab PT Goals Patient Stated Goal: Walk again PT Goal Formulation: With patient Time For Goal Achievement: 12/27/18 Potential to Achieve Goals: Fair    Frequency Min 3X/week   Barriers to discharge        Co-evaluation PT/OT/SLP Co-Evaluation/Treatment: Yes Reason for Co-Treatment: For  patient/therapist safety PT goals addressed during session: Mobility/safety with mobility OT goals addressed during session: ADL's and self-care       AM-PAC PT "6 Clicks" Mobility  Outcome Measure Help needed turning from your back to your side while in a flat bed without using bedrails?: A Little Help needed moving from lying on your back to sitting on the side of a flat bed without using bedrails?: A Lot Help needed moving to and from a bed to a chair (including a wheelchair)?: A Lot Help needed standing up from a chair using your arms (e.g., wheelchair or bedside chair)?: A Lot Help needed to walk in hospital room?: A Lot Help needed climbing 3-5 steps with a railing? : Total 6 Click Score: 12    End of Session Equipment Utilized During Treatment: Gait belt Activity Tolerance: Patient limited by fatigue Patient left: in bed;with call bell/phone within reach;with bed alarm set Nurse Communication: Mobility status PT Visit Diagnosis: Difficulty in walking, not elsewhere classified (R26.2);Muscle weakness (generalized) (M62.81)    Time: 8938-1017 PT Time Calculation (min) (ACUTE ONLY): 32 min   Charges:   PT Evaluation $PT Eval Low Complexity: 1 Low         [place] Pager [phone] Office [phone]   Kilee Hedding 12/13/2018, 12:17 PM

## 2018-12-14 ENCOUNTER — Encounter (HOSPITAL_COMMUNITY): Payer: Self-pay

## 2018-12-14 DIAGNOSIS — I480 Paroxysmal atrial fibrillation: Secondary | ICD-10-CM | POA: Diagnosis not present

## 2018-12-14 DIAGNOSIS — I2692 Saddle embolus of pulmonary artery without acute cor pulmonale: Secondary | ICD-10-CM | POA: Diagnosis not present

## 2018-12-14 DIAGNOSIS — R29898 Other symptoms and signs involving the musculoskeletal system: Secondary | ICD-10-CM | POA: Diagnosis not present

## 2018-12-14 DIAGNOSIS — I825Y1 Chronic embolism and thrombosis of unspecified deep veins of right proximal lower extremity: Secondary | ICD-10-CM | POA: Diagnosis not present

## 2018-12-14 DIAGNOSIS — N390 Urinary tract infection, site not specified: Secondary | ICD-10-CM | POA: Diagnosis not present

## 2018-12-14 LAB — GLUCOSE, CAPILLARY
GLUCOSE-CAPILLARY: 107 mg/dL — AB (ref 70–99)
Glucose-Capillary: 119 mg/dL — ABNORMAL HIGH (ref 70–99)
Glucose-Capillary: 136 mg/dL — ABNORMAL HIGH (ref 70–99)
Glucose-Capillary: 153 mg/dL — ABNORMAL HIGH (ref 70–99)

## 2018-12-14 LAB — CBC
HCT: 35.5 % — ABNORMAL LOW (ref 39.0–52.0)
Hemoglobin: 10.9 g/dL — ABNORMAL LOW (ref 13.0–17.0)
MCH: 29.4 pg (ref 26.0–34.0)
MCHC: 30.7 g/dL (ref 30.0–36.0)
MCV: 95.7 fL (ref 80.0–100.0)
PLATELETS: 193 10*3/uL (ref 150–400)
RBC: 3.71 MIL/uL — ABNORMAL LOW (ref 4.22–5.81)
RDW: 14.6 % (ref 11.5–15.5)
WBC: 7.2 10*3/uL (ref 4.0–10.5)
nRBC: 0 % (ref 0.0–0.2)

## 2018-12-14 LAB — BASIC METABOLIC PANEL
Anion gap: 7 (ref 5–15)
BUN: 12 mg/dL (ref 8–23)
CO2: 21 mmol/L — ABNORMAL LOW (ref 22–32)
Calcium: 8.2 mg/dL — ABNORMAL LOW (ref 8.9–10.3)
Chloride: 112 mmol/L — ABNORMAL HIGH (ref 98–111)
Creatinine, Ser: 0.73 mg/dL (ref 0.61–1.24)
GFR calc Af Amer: >60 mL/min (ref >60)
GFR calc non Af Amer: >60 mL/min (ref >60)
Glucose, Bld: 127 mg/dL — ABNORMAL HIGH (ref 70–99)
Potassium: 4 mmol/L (ref 3.5–5.1)
Sodium: 140 mmol/L (ref 135–145)

## 2018-12-14 LAB — URINE CULTURE: Culture: NO GROWTH

## 2018-12-14 NOTE — Progress Notes (Signed)
PROGRESS NOTE    Matthew Velez  RWE:[id number] DOB: [birth year] DOA: 12/12/2018 PCP: Lajean Manes, MD   Brief Narrative:  HPI per Dr. Lily Peer is a 85 y.o. male with medical history significant of paroxysmal atrial fibrillation, hypertension, type 2 diabetes, chronic diastolic congestive heart failure, right leg DVT, saddle PE, prostate cancer presenting to the hospital for evaluation of weakness.  Patient reports weakness in both of his legs for the past 2 weeks and difficulty ambulating.  States he is struggling even to get to his walker.  The nurse at his independent living facility is helping him go to the bathroom.  Denies having any fevers or chills.  Denies any back pain.  States his feet are chronically numb due to diabetic neuropathy.  Reports having intermittent, stabbing pain in his left hip when walking for the past 1 day.  Denies any falls or injury.  Denies any chest pain, shortness of breath, or cough.  Reports having dysuria.  No urinary frequency or urgency.   Assessment & Plan:   Principal Problem:   UTI (urinary tract infection) Active Problems:   Saddle pulmonary embolus (HCC)   Atrial fibrillation, transient   Right leg DVT (HCC)   Bilateral leg weakness  1 UTI Patient presented with generalized lower extremity weakness.  Patient noted to be afebrile.  Urinalysis with large amount of leukocytes greater than 50 WBCs and few bacteria.  Urine cultures with no growth.  Continue IV Rocephin to complete a 3-day course of antibiotic treatment.  Supportive care.  2.  Bilateral lower extremity weakness No focal neurological weakness noted.  Afebrile.  No leukocytosis.  May be secondary to problem #1.  Plain films of the L-spine negative.  Plain films of the pelvis and left hip negative.  Chest x-ray negative.  Patient feels improving slowly and clinically.  May be secondary to UTI.  Continue IV antibiotics.  PT/OT.  Needs skilled nursing facility.  3.  History of  saddle PE and right lower extremity DVT Lower extremity Dopplers negative for DVT.  Continue current regimen of Xarelto 20 mg daily.  4.  Paroxysmal atrial fibrillation Currently rate controlled.  Xarelto for anticoagulation.  5.  Hypertension Stable.  6.  Dehydration Gentle hydration.  7.  Gastroesophageal reflux disease PPI.  8.  Type 2 diabetes CBG of 127 this morning.  Continue sliding scale insulin.   DVT prophylaxis: Xarelto Code Status: DNR Family Communication: Updated patient.  No family at bedside. Disposition Plan: Likely to skilled nursing facility whenever bed available hopefully in the next 24 to 48 hours.   Consultants:   None  Procedures:   Plain films of the L-spine 12/12/2018  Plain films of the pelvis and left hip 12/12/2018  Chest x-ray 12/12/2018  Lower extremity Dopplers 12/13/2018  Antimicrobials:  IV Rocephin 12/12/2018   Subjective: Patient laying in bed.  Patient states he is feeling better.  Denies any chest pain or shortness of breath.  Tolerating diet.  Objective: Vitals:   12/13/18 0539 12/13/18 1418 12/13/18 2103 12/14/18 0512  BP: 120/70 123/67 (!) 143/75 (!) 125/96  Pulse: 97 84 86 95  Resp: 15 18 17 16   Temp: 98 F (36.7 C) 97.9 F (36.6 C) 97.8 F (36.6 C) 97.6 F (36.4 C)  TempSrc: Oral Oral Oral Oral  SpO2: 92% 98% 97% 93%  Weight:      Height:        Intake/Output Summary (Last 24 hours) at 12/14/2018 1130 Last data  filed at 12/14/2018 1000 Gross per 24 hour  Intake 1393.05 ml  Output 901 ml  Net 492.05 ml   Filed Weights   12/13/18 0008  Weight: 76 kg    Examination:  General exam: Appears calm and comfortable  Respiratory system: Clear to auscultation. Respiratory effort normal. Cardiovascular system: S1 & S2 heard, RRR. No JVD, murmurs, rubs, gallops or clicks. No pedal edema. Gastrointestinal system: Abdomen is nondistended, soft and nontender. No organomegaly or masses felt. Normal bowel sounds  heard. Central nervous system: Alert and oriented. No focal neurological deficits. Extremities: Symmetric 5 x 5 power. Skin: No rashes, lesions or ulcers Psychiatry: Judgement and insight appear normal. Mood & affect appropriate.     Data Reviewed: I have personally reviewed following labs and imaging studies  CBC: Recent Labs  Lab 12/12/18 1757 12/14/18 0522  WBC 8.2 7.2  NEUTROABS 6.4  --   HGB 12.6* 10.9*  HCT 40.3 35.5*  MCV 94.6 95.7  PLT 228 299   Basic Metabolic Panel: Recent Labs  Lab 12/12/18 1757 12/14/18 0522  NA 136 140  K 4.2 4.0  CL 103 112*  CO2 26 21*  GLUCOSE 152* 127*  BUN 15 12  CREATININE 0.80 0.73  CALCIUM 9.0 8.2*   GFR: Estimated Creatinine Clearance: 50.2 mL/min (by C-G formula based on SCr of 0.73 mg/dL). Liver Function Tests: Recent Labs  Lab 12/12/18 1757  AST 20  ALT 12  ALKPHOS 67  BILITOT 0.5  PROT 7.0  ALBUMIN 3.7   No results for input(s): LIPASE, AMYLASE in the last 168 hours. No results for input(s): AMMONIA in the last 168 hours. Coagulation Profile: No results for input(s): INR, PROTIME in the last 168 hours. Cardiac Enzymes: No results for input(s): CKTOTAL, CKMB, CKMBINDEX, TROPONINI in the last 168 hours. BNP (last 3 results) No results for input(s): PROBNP in the last 8760 hours. HbA1C: No results for input(s): HGBA1C in the last 72 hours. CBG: Recent Labs  Lab 12/13/18 0804 12/13/18 1150 12/13/18 1742 12/13/18 2107 12/14/18 0727  GLUCAP 144* 198* 98 142* 119*   Lipid Profile: No results for input(s): CHOL, HDL, LDLCALC, TRIG, CHOLHDL, LDLDIRECT in the last 72 hours. Thyroid Function Tests: No results for input(s): TSH, T4TOTAL, FREET4, T3FREE, THYROIDAB in the last 72 hours. Anemia Panel: No results for input(s): VITAMINB12, FOLATE, FERRITIN, TIBC, IRON, RETICCTPCT in the last 72 hours. Sepsis Labs: No results for input(s): PROCALCITON, LATICACIDVEN in the last 168 hours.  Recent Results (from the  past 240 hour(s))  Urine culture     Status: None   Collection Time: 12/12/18  8:05 PM  Result Value Ref Range Status   Specimen Description   Final    URINE, RANDOM Performed at [facility] [address] [place], [place] [address]    Special Requests   Final    NONE Performed at [facility], [place] [address] [place], [place] [address]    Culture   Final    NO GROWTH Performed at [facility], [place] [address] [place], [place] [address]    Report Status 12/14/2018 FINAL  Final         Radiology Studies: Dg Chest 2 View  Result Date: 12/12/2018 CLINICAL DATA:  Generalized weakness. EXAM: CHEST - 2 VIEW COMPARISON:  11/21/2015 FINDINGS: 1727 hours. Low volume film. The cardio pericardial silhouette is enlarged. Chronic atelectasis or scarring at the left base is similar to prior. The lungs are clear without focal  pneumonia, edema, pneumothorax or pleural effusion. The visualized bony structures of the thorax are intact. Telemetry leads overlie the chest. IMPRESSION: Stable.  No acute findings. Electronically Signed   By: Misty Stanley M.D.   On: 12/12/2018 17:36   Dg Lumbar Spine Complete  Result Date: 12/12/2018 CLINICAL DATA:  Generalized weakness.  Low back pain. EXAM: LUMBAR SPINE - COMPLETE 4+ VIEW COMPARISON:  None. FINDINGS: Bones are diffusely demineralized. No evidence for an acute fracture. No subluxation. Degenerative disc disease most advanced at L3-4 and L4-5. Facet osteoarthritis noted lower lumbar levels. IMPRESSION: Degenerative changes without acute bony abnormality. Electronically Signed   By: Misty Stanley M.D.   On: 12/12/2018 19:58   Dg Hip Unilat W Or Wo Pelvis 2-3 Views Left  Result Date: 12/12/2018 CLINICAL DATA:  Chronic intermittent weakness which has worsened over the last month. Left hip pain. No known injury. EXAM: DG HIP (WITH OR WITHOUT PELVIS) 2-3V LEFT COMPARISON:  None. FINDINGS: There is no evidence of  hip fracture or dislocation. There is no evidence of arthropathy or other focal bone abnormality. IMPRESSION: Negative exam. Electronically Signed   By: Inge Rise M.D.   On: 12/12/2018 19:58   Vas [place] Lower Extremity Venous (dvt)  Result Date: 12/14/2018  Lower Venous Study Indications: Swelling, and Edema.  Performing Technologist: Abram Sander RVS  Examination Guidelines: A complete evaluation includes B-mode imaging, spectral Doppler, color Doppler, and power Doppler as needed of all accessible portions of each vessel. Bilateral testing is considered an integral part of a complete examination. Limited examinations for reoccurring indications may be performed as noted.  Right Venous Findings: +---------+---------------+---------+-----------+----------+-------+            Compressibility Phasicity Spontaneity Properties Summary  +---------+---------------+---------+-----------+----------+-------+  CFV       Full            Yes       Yes                             +---------+---------------+---------+-----------+----------+-------+  SFJ       Full                                                      +---------+---------------+---------+-----------+----------+-------+  FV Prox   Full                                                      +---------+---------------+---------+-----------+----------+-------+  FV Mid    Full                                                      +---------+---------------+---------+-----------+----------+-------+  FV Distal Full                                                      +---------+---------------+---------+-----------+----------+-------+  PFV  Full                                                      +---------+---------------+---------+-----------+----------+-------+  POP       Full            Yes       Yes                             +---------+---------------+---------+-----------+----------+-------+  PTV       Full                                                       +---------+---------------+---------+-----------+----------+-------+  PERO      Full                                                      +---------+---------------+---------+-----------+----------+-------+  Left Venous Findings: +---+---------------+---------+-----------+----------+-------+      Compressibility Phasicity Spontaneity Properties Summary  +---+---------------+---------+-----------+----------+-------+  CFV Full            Yes       Yes                             +---+---------------+---------+-----------+----------+-------+    Summary: Right: There is no evidence of deep vein thrombosis in the lower extremity. No cystic structure found in the popliteal fossa. Left: No evidence of common femoral vein obstruction.  *See table(s) above for measurements and observations. Electronically signed by Harold Barban MD on 12/14/2018 at 8:00:01 AM.    Final         Scheduled Meds:  insulin aspart  0-9 Units Subcutaneous TID WC   pantoprazole  40 mg Oral Daily   rivaroxaban  20 mg Oral Q supper   Continuous Infusions:  sodium chloride 75 mL/hr at 12/14/18 0822   cefTRIAXone (ROCEPHIN)  IV Stopped (12/13/18 2122)     LOS: 0 days    Time spent: 35 minutes    Irine Seal, MD [facility]  If 7PM-7AM, please contact night-coverage [email or web address] Password [email or web address] 12/14/2018, 11:30 AM

## 2018-12-14 NOTE — Progress Notes (Signed)
Physical Therapy Treatment Patient Details Name: Matthew Velez MRN: [id number] DOB: October 19, 1918 Today's Date: 12/14/2018    History of Present Illness Pt admitted from [facility] 2* generalized weakness and dx with UTI.  Pt with hx of DM, a-fib, and saddle PE     PT Comments    Pt progressing, incr gait distance today but seated rest required after 22', requiring only min assist (compared to mod assist +2 yesterday) Pt is motivated to be able to return to ILF at [facility] with HHPT rather than SNF. Continue PT POC  Follow Up Recommendations  SNF vs HHPT     Equipment Recommendations  None recommended by PT    Recommendations for Other Services       Precautions / Restrictions Precautions Precautions: Fall Restrictions Weight Bearing Restrictions: No    Mobility  Bed Mobility Overal bed mobility: Needs Assistance Bed Mobility: Sit to Supine;Supine to Sit     Supine to sit: Min assist Sit to supine: Min assist   General bed mobility comments: cues for sequence and assist to elevate trunk and bring LEs up onto bed.  Transfers Overall transfer level: Needs assistance Equipment used: Rolling walker (2 wheeled) Transfers: Sit to/from [facility] Sit to Stand: Min assist Stand pivot transfers: Min assist       General transfer comment: cues for hand placement and safety  Ambulation/Gait Ambulation/Gait assistance: Min assist;+2 safety/equipment Gait Distance (Feet): 22 Feet Assistive device: Rolling walker (2 wheeled) Gait Pattern/deviations: Step-through pattern;Decreased stride length Gait velocity: decr   General Gait Details: cues for posture and RW position as well as to self monitor fatigue/dyspnea   Stairs             Wheelchair Mobility    Modified Rankin (Stroke Patients Only)       Balance   Sitting-balance support: No upper extremity supported;Feet supported Sitting balance-Leahy Scale: Good     Standing balance  support: Bilateral upper extremity supported(reliant on UEs) Standing balance-Leahy Scale: Poor                              Cognition Arousal/Alertness: Awake/alert Behavior During Therapy: WFL for tasks assessed/performed Overall Cognitive Status: Within Functional Limits for tasks assessed                                        Exercises      General Comments        Pertinent Vitals/Pain Pain Assessment: No/denies pain Pain Intervention(s): Monitored during session    Home Living                      Prior Function            PT Goals (current goals can now be found in the care plan section) Acute Rehab PT Goals Patient Stated Goal: walk, go back to ILF PT Goal Formulation: With patient Time For Goal Achievement: 12/27/18 Potential to Achieve Goals: Good Progress towards PT goals: Progressing toward goals    Frequency    Min 3X/week      PT Plan Current plan remains appropriate    Co-evaluation              AM-PAC PT "6 Clicks" Mobility   Outcome Measure  Help needed turning from your back to your side while in  a flat bed without using bedrails?: A Little Help needed moving from lying on your back to sitting on the side of a flat bed without using bedrails?: A Little Help needed moving to and from a bed to a chair (including a wheelchair)?: A Little Help needed standing up from a chair using your arms (e.g., wheelchair or bedside chair)?: A Little Help needed to walk in hospital room?: A Little Help needed climbing 3-5 steps with a railing? : A Lot 6 Click Score: 17    End of Session Equipment Utilized During Treatment: Gait belt Activity Tolerance: Patient tolerated treatment well Patient left: in bed;with call bell/phone within reach;with bed alarm set   PT Visit Diagnosis: Difficulty in walking, not elsewhere classified (R26.2);Muscle weakness (generalized) (M62.81)     Time: 2035-5974 PT Time  Calculation (min) (ACUTE ONLY): 26 min  Charges:  $Gait Training: 23-37 mins                     Kenyon Ana, PT  Pager: [phone] Acute Rehab Dept [facility]): [phone]   12/14/2018    [facility] 12/14/2018, 3:43 PM

## 2018-12-15 DIAGNOSIS — H4010X4 Unspecified open-angle glaucoma, indeterminate stage: Secondary | ICD-10-CM | POA: Diagnosis not present

## 2018-12-15 DIAGNOSIS — K219 Gastro-esophageal reflux disease without esophagitis: Secondary | ICD-10-CM | POA: Diagnosis not present

## 2018-12-15 DIAGNOSIS — R531 Weakness: Secondary | ICD-10-CM | POA: Diagnosis not present

## 2018-12-15 DIAGNOSIS — Z79899 Other long term (current) drug therapy: Secondary | ICD-10-CM | POA: Diagnosis not present

## 2018-12-15 DIAGNOSIS — E1140 Type 2 diabetes mellitus with diabetic neuropathy, unspecified: Secondary | ICD-10-CM | POA: Diagnosis not present

## 2018-12-15 DIAGNOSIS — R269 Unspecified abnormalities of gait and mobility: Secondary | ICD-10-CM | POA: Diagnosis not present

## 2018-12-15 DIAGNOSIS — E119 Type 2 diabetes mellitus without complications: Secondary | ICD-10-CM | POA: Diagnosis not present

## 2018-12-15 DIAGNOSIS — R52 Pain, unspecified: Secondary | ICD-10-CM | POA: Diagnosis not present

## 2018-12-15 DIAGNOSIS — I2692 Saddle embolus of pulmonary artery without acute cor pulmonale: Secondary | ICD-10-CM | POA: Diagnosis not present

## 2018-12-15 DIAGNOSIS — N390 Urinary tract infection, site not specified: Secondary | ICD-10-CM | POA: Diagnosis not present

## 2018-12-15 DIAGNOSIS — Z7401 Bed confinement status: Secondary | ICD-10-CM | POA: Diagnosis not present

## 2018-12-15 DIAGNOSIS — M150 Primary generalized (osteo)arthritis: Secondary | ICD-10-CM | POA: Diagnosis not present

## 2018-12-15 DIAGNOSIS — K5909 Other constipation: Secondary | ICD-10-CM | POA: Diagnosis not present

## 2018-12-15 DIAGNOSIS — I480 Paroxysmal atrial fibrillation: Secondary | ICD-10-CM | POA: Diagnosis not present

## 2018-12-15 DIAGNOSIS — M6281 Muscle weakness (generalized): Secondary | ICD-10-CM | POA: Diagnosis not present

## 2018-12-15 DIAGNOSIS — Z86711 Personal history of pulmonary embolism: Secondary | ICD-10-CM | POA: Diagnosis not present

## 2018-12-15 DIAGNOSIS — I82401 Acute embolism and thrombosis of unspecified deep veins of right lower extremity: Secondary | ICD-10-CM | POA: Diagnosis not present

## 2018-12-15 DIAGNOSIS — Z111 Encounter for screening for respiratory tuberculosis: Secondary | ICD-10-CM | POA: Diagnosis not present

## 2018-12-15 DIAGNOSIS — C61 Malignant neoplasm of prostate: Secondary | ICD-10-CM | POA: Diagnosis not present

## 2018-12-15 DIAGNOSIS — I825Y1 Chronic embolism and thrombosis of unspecified deep veins of right proximal lower extremity: Secondary | ICD-10-CM | POA: Diagnosis not present

## 2018-12-15 DIAGNOSIS — I4891 Unspecified atrial fibrillation: Secondary | ICD-10-CM | POA: Diagnosis not present

## 2018-12-15 DIAGNOSIS — M2550 Pain in unspecified joint: Secondary | ICD-10-CM | POA: Diagnosis not present

## 2018-12-15 DIAGNOSIS — R29898 Other symptoms and signs involving the musculoskeletal system: Secondary | ICD-10-CM | POA: Diagnosis not present

## 2018-12-15 DIAGNOSIS — E860 Dehydration: Secondary | ICD-10-CM | POA: Diagnosis not present

## 2018-12-15 DIAGNOSIS — I10 Essential (primary) hypertension: Secondary | ICD-10-CM | POA: Diagnosis not present

## 2018-12-15 DIAGNOSIS — I509 Heart failure, unspecified: Secondary | ICD-10-CM | POA: Diagnosis not present

## 2018-12-15 LAB — BASIC METABOLIC PANEL
Anion gap: 8 (ref 5–15)
BUN: 12 mg/dL (ref 8–23)
CHLORIDE: 111 mmol/L (ref 98–111)
CO2: 21 mmol/L — ABNORMAL LOW (ref 22–32)
Calcium: 8.4 mg/dL — ABNORMAL LOW (ref 8.9–10.3)
Creatinine, Ser: 0.63 mg/dL (ref 0.61–1.24)
GFR calc Af Amer: >60 mL/min (ref >60)
GFR calc non Af Amer: >60 mL/min (ref >60)
Glucose, Bld: 128 mg/dL — ABNORMAL HIGH (ref 70–99)
Potassium: 3.8 mmol/L (ref 3.5–5.1)
Sodium: 140 mmol/L (ref 135–145)

## 2018-12-15 LAB — GLUCOSE, CAPILLARY
Glucose-Capillary: 133 mg/dL — ABNORMAL HIGH (ref 70–99)
Glucose-Capillary: 163 mg/dL — ABNORMAL HIGH (ref 70–99)

## 2018-12-15 MED ORDER — FUROSEMIDE 10 MG/ML IJ SOLN
20.0000 mg | Freq: Once | INTRAMUSCULAR | Status: AC
  Administered 2018-12-15: 20 mg via INTRAVENOUS
  Filled 2018-12-15: qty 2

## 2018-12-15 MED ORDER — FUROSEMIDE 40 MG PO TABS: 40.0000 mg | ORAL_TABLET | Freq: Every day | ORAL | Status: AC

## 2018-12-15 MED ORDER — POTASSIUM CHLORIDE CRYS ER 20 MEQ PO TBCR
20.0000 meq | EXTENDED_RELEASE_TABLET | Freq: Once | ORAL | Status: AC
  Administered 2018-12-15: 20 meq via ORAL
  Filled 2018-12-15: qty 1

## 2018-12-15 MED ORDER — LISINOPRIL 10 MG PO TABS: 10.0000 mg | ORAL_TABLET | Freq: Every day | ORAL | Status: AC

## 2018-12-15 NOTE — Progress Notes (Signed)
Physical Therapy Treatment Patient Details Name: Matthew Velez MRN: [id number] DOB: Jul 13, 1919 Today's Date: 12/15/2018    History of Present Illness Pt admitted from [facility] 2* generalized weakness and dx with UTI.  Pt with hx of DM, a-fib, and saddle PE (14)    PT Comments    Pt OOB in recliner.  Assisted with transfers and amb.  General transfer comment: 25% cues for hand placement and safety with turns   General Gait Details: cues for posture and RW position as well as to self monitor fatigue/dyspnea  Limited activity tolerance  Unsteady  HIGH FALL RISK Pt will need ST Rehab at SNF prior to returning to Indep Living  Follow Up Recommendations  SNF     Equipment Recommendations  None recommended by PT    Recommendations for Other Services       Precautions / Restrictions Precautions Precautions: Fall Restrictions Weight Bearing Restrictions: No    Mobility  Bed Mobility               General bed mobility comments: pt OOB in recliner   Transfers Overall transfer level: Needs assistance Equipment used: Rolling walker (2 wheeled) Transfers: Sit to/from Stand Sit to Stand: Min assist Stand pivot transfers: Mod assist       General transfer comment: 25% cues for hand placement and safety with turns   Ambulation/Gait Ambulation/Gait assistance: Min assist;Mod assist Gait Distance (Feet): 34 Feet Assistive device: Rolling walker (2 wheeled) Gait Pattern/deviations: Decreased stance time - right;Decreased stride length;Trunk flexed Gait velocity: decreased    General Gait Details: cues for posture and RW position as well as to self monitor fatigue/dyspnea  Limited activity tolerance  Unsteady  HIGH FALL RISK   Stairs             Wheelchair Mobility    Modified Rankin (Stroke Patients Only)       Balance                                            Cognition Arousal/Alertness: Awake/alert Behavior During Therapy: WFL for  tasks assessed/performed Overall Cognitive Status: Within Functional Limits for tasks assessed                                 General Comments: pleasant       Exercises      General Comments        Pertinent Vitals/Pain Pain Assessment: No/denies pain Pain Intervention(s): Monitored during session    Home Living                      Prior Function            PT Goals (current goals can now be found in the care plan section) Progress towards PT goals: Progressing toward goals    Frequency    Min 3X/week      PT Plan Current plan remains appropriate    Co-evaluation              AM-PAC PT "6 Clicks" Mobility   Outcome Measure  Help needed turning from your back to your side while in a flat bed without using bedrails?: A Little Help needed moving from lying on your back to sitting on the side of a flat bed without using  bedrails?: A Little Help needed moving to and from a bed to a chair (including a wheelchair)?: A Little Help needed standing up from a chair using your arms (e.g., wheelchair or bedside chair)?: A Lot Help needed to walk in hospital room?: A Lot Help needed climbing 3-5 steps with a railing? : A Lot 6 Click Score: 15    End of Session Equipment Utilized During Treatment: Gait belt Activity Tolerance: Patient limited by fatigue Patient left: in chair;with call bell/phone within reach;with chair alarm set Nurse Communication: Mobility status PT Visit Diagnosis: Difficulty in walking, not elsewhere classified (R26.2);Muscle weakness (generalized) (M62.81)     Time: 7505-1833 PT Time Calculation (min) (ACUTE ONLY): 16 min  Charges:  $Gait Training: 8-22 mins                     Rica Koyanagi  PTA Acute  Rehabilitation Services Pager      [phone] Office      [phone]

## 2018-12-15 NOTE — Progress Notes (Signed)
Occupational Therapy Treatment Patient Details Name: SAMI FROH MRN: [id number] DOB: [birth year] Today's Date: 12/15/2018    History of present illness Pt admitted from [facility] 2* generalized weakness and dx with UTI.  Pt with hx of DM, a-fib, and saddle PE (14)   OT comments  Pt very agreeable and ready to go to rehab  Follow Up Recommendations  SNF;Supervision/Assistance - 24 hour    Equipment Recommendations  Other (comment)(defer to next venue)    Recommendations for Other Services      Precautions / Restrictions Precautions Precautions: Fall Restrictions Weight Bearing Restrictions: No       Mobility Bed Mobility               General bed mobility comments: pt OOB  Transfers Overall transfer level: Needs assistance Equipment used: Rolling walker (2 wheeled) Transfers: Sit to/from Stand   Stand pivot transfers: Mod assist       General transfer comment: cues for hand placement and safety        ADL either performed or assessed with clinical judgement   ADL Overall ADL's : Needs assistance/impaired     Grooming: Wash/dry face;Set up;Standing                   Toilet Transfer: RW;Comfort height toilet;Stand-pivot;Cueing for safety;Cueing for sequencing;Moderate assistance   Toileting- Clothing Manipulation and Hygiene: Moderate assistance;Cueing for sequencing;Cueing for safety               Vision Patient Visual Report: No change from baseline            Cognition Arousal/Alertness: Awake/alert Behavior During Therapy: WFL for tasks assessed/performed Overall Cognitive Status: Within Functional Limits for tasks assessed                                                     Pertinent Vitals/ Pain       Pain Assessment: No/denies pain Pain Intervention(s): Monitored during session         Frequency  Min 2X/week        Progress Toward Goals  OT Goals(current goals can now be found in the  care plan section)  Progress towards OT goals: Progressing toward goals     Plan Discharge plan remains appropriate    Co-evaluation                 AM-PAC OT "6 Clicks" Daily Activity     Outcome Measure   Help from another person eating meals?: A Little Help from another person taking care of personal grooming?: A Little Help from another person toileting, which includes using toliet, bedpan, or urinal?: A Lot Help from another person bathing (including washing, rinsing, drying)?: A Lot Help from another person to put on and taking off regular upper body clothing?: A Little Help from another person to put on and taking off regular lower body clothing?: A Lot 6 Click Score: 15    End of Session Equipment Utilized During Treatment: Rolling walker  OT Visit Diagnosis: Unsteadiness on feet (R26.81);Muscle weakness (generalized) (M62.81)   Activity Tolerance Patient tolerated treatment well   Patient Left with call bell/phone within reach;in chair;with chair alarm set   Nurse Communication Mobility status        Time: 8413-2440 OT Time Calculation (min): 14 min  Charges: OT General  Charges $OT Visit: 1 Visit OT Treatments $Self Care/Home Management : 8-22 mins  Kari Baars, [place] [phone] Office- [phone], Edwena Felty D 12/15/2018, 2:01 PM

## 2018-12-15 NOTE — Progress Notes (Signed)
Awaiting [employer] authorization for admission to [facility] SNF. Pt is resident of independent living at [facility].  Sharren Bridge, MSW, LCSW Clinical Social Work 12/15/2018 [phone]

## 2018-12-15 NOTE — Discharge Summary (Signed)
Physician Discharge Summary  Matthew Velez RUE:[id number] DOB: 1919-04-13 DOA: 12/12/2018  PCP: Lajean Manes, MD  Admit date: 12/12/2018 Discharge date: 12/15/2018  Time spent: 50 minutes  Recommendations for Outpatient Follow-up:  1. Follow-up with Stoneking, Hal, MD in 1 to 2 weeks.  On follow-up patient will need a basic metabolic profile done to follow-up on electrolytes and renal function.   Discharge Diagnoses:  Principal Problem:   UTI (urinary tract infection) Active Problems:   Saddle pulmonary embolus (HCC)   Atrial fibrillation, transient   Right leg DVT (HCC)   Bilateral leg weakness   Discharge Condition: Stable and improved  Diet recommendation: Heart healthy  Filed Weights   12/13/18 0008  Weight: 76 kg    History of present illness:  Per Dr. Lily Peer is a 85 y.o. male with medical history significant of paroxysmal atrial fibrillation, hypertension, type 2 diabetes, chronic diastolic congestive heart failure, right leg DVT, saddle PE, prostate cancer presenting to the hospital for evaluation of weakness.  Patient reports weakness in both of his legs for the past 2 weeks and difficulty ambulating.  States he is struggling even to get to his walker.  The nurse at his independent living facility is helping him go to the bathroom.  Denies having any fevers or chills.  Denies any back pain.  States his feet are chronically numb due to diabetic neuropathy.  Reports having intermittent, stabbing pain in his left hip when walking for the past 1 day.  Denies any falls or injury.  Denies any chest pain, shortness of breath, or cough.  Reports having dysuria.  No urinary frequency or urgency.   Hospital Course:  1 UTI Patient presented with generalized lower extremity weakness.  Patient noted to be afebrile.  Urinalysis with large amount of leukocytes greater than 50 WBCs and few bacteria.  Urine cultures with no growth.  Patient was placed on IV Rocephin during  the hospitalization and received a 3-day course of antibiotic treatment.  No further antibiotics needed on discharge.  Outpatient follow-up with PCP.  2.  Bilateral lower extremity weakness No focal neurological weakness noted.  Afebrile.  No leukocytosis.  May be secondary to problem #1.  Plain films of the L-spine negative.  Plain films of the pelvis and left hip negative.  Chest x-ray negative.  Patient improved clinically during the hospitalization.  It was felt patient's weakness likely secondary to probable UTI.  Patient received 3 days of IV antibiotics.  Patient was seen by PT/OT who recommended skilled nursing facility.  Patient will be discharged to skilled nursing facility.   3.  History of saddle PE and right lower extremity DVT Lower extremity Dopplers negative for DVT.    Patient maintained on home regimen of Xarelto at 20 mg daily.  Outpatient follow-up.   4.  Paroxysmal atrial fibrillation Remained rate controlled during the hospitalization.  Patient maintained on Xarelto for anticoagulation.    5.  Hypertension Remained stable throughout the hospitalization.  Patient's diuretics and ACE inhibitor were held and will be resumed on discharge.    6.  Dehydration Patient hydrated gently with IV fluids.  Patient's diuretics were held and be resumed on discharge.    7.  Gastroesophageal reflux disease Patient maintained on a PPI.  8.  Type 2 diabetes Patient is oral hypoglycemic agents were held during the hospitalization and patient maintained on sliding scale insulin.   Procedures:  Plain films of the L-spine 12/12/2018  Plain films of the  pelvis and left hip 12/12/2018  Chest x-ray 12/12/2018  Lower extremity Dopplers 12/13/2018   Consultations:  None  Discharge Exam: Vitals:   12/15/18 0516 12/15/18 1330  BP: 136/71 119/62  Pulse: 88 92  Resp: 17 16  Temp: 98.3 F (36.8 C) 97.8 F (36.6 C)  SpO2: 95% 94%    General: NAD Cardiovascular:  RRR Respiratory: CTAB  Discharge Instructions   Discharge Instructions    Diet - low sodium heart healthy   Complete by:  As directed    Increase activity slowly   Complete by:  As directed      Allergies as of 12/15/2018      Reactions   Chocolate Shortness Of Breath   Fish Allergy Anaphylaxis   Other Anaphylaxis   Flu shot causes Anaphylaxis   Iodine Rash      Medication List    TAKE these medications   Cranberry 300 MG tablet Take 300 mg by mouth daily.   furosemide 40 MG tablet Commonly known as:  LASIX Take 1 tablet (40 mg total) by mouth daily. Start taking on:  December 16, 2018   glipiZIDE 5 MG tablet Commonly known as:  GLUCOTROL Take 5 mg by mouth daily.   Glucosamine Sulfate 500 MG Tabs Take 1 tablet by mouth daily.   lisinopril 10 MG tablet Commonly known as:  PRINIVIL,ZESTRIL Take 1 tablet (10 mg total) by mouth daily. Start taking on:  December 17, 2018 What changed:  These instructions start on December 17, 2018. If you are unsure what to do until then, ask your doctor or other care provider.   omeprazole 20 MG capsule Commonly known as:  PRILOSEC Take 20 mg by mouth 2 (two) times daily.   rivaroxaban 20 MG Tabs tablet Commonly known as:  XARELTO Take 1 tablet (20 mg total) by mouth daily. With lunch (with food) What changed:    when to take this  additional instructions  Another medication with the same name was removed. Continue taking this medication, and follow the directions you see here.      Allergies  Allergen Reactions  . Chocolate Shortness Of Breath  . Fish Allergy Anaphylaxis  . Other Anaphylaxis    Flu shot causes Anaphylaxis  . Iodine Rash   Follow-up Information    Stoneking, Hal, MD. Schedule an appointment as soon as possible for a visit in 1 week(s).   Specialty:  Internal Medicine Why:  f/u in 1-2 weeks. Contact information: [address] [place] [address] [phone]            The  results of significant diagnostics from this hospitalization (including imaging, microbiology, ancillary and laboratory) are listed below for reference.    Significant Diagnostic Studies: Dg Chest 2 View  Result Date: 12/12/2018 CLINICAL DATA:  Generalized weakness. EXAM: CHEST - 2 VIEW COMPARISON:  11/21/2015 FINDINGS: 1727 hours. Low volume film. The cardio pericardial silhouette is enlarged. Chronic atelectasis or scarring at the left base is similar to prior. The lungs are clear without focal pneumonia, edema, pneumothorax or pleural effusion. The visualized bony structures of the thorax are intact. Telemetry leads overlie the chest. IMPRESSION: Stable.  No acute findings. Electronically Signed   By: Misty Stanley M.D.   On: 12/12/2018 17:36   Dg Lumbar Spine Complete  Result Date: 12/12/2018 CLINICAL DATA:  Generalized weakness.  Low back pain. EXAM: LUMBAR SPINE - COMPLETE 4+ VIEW COMPARISON:  None. FINDINGS: Bones are diffusely demineralized. No evidence for  an acute fracture. No subluxation. Degenerative disc disease most advanced at L3-4 and L4-5. Facet osteoarthritis noted lower lumbar levels. IMPRESSION: Degenerative changes without acute bony abnormality. Electronically Signed   By: Misty Stanley M.D.   On: 12/12/2018 19:58   Dg Hip Unilat W Or Wo Pelvis 2-3 Views Left  Result Date: 12/12/2018 CLINICAL DATA:  Chronic intermittent weakness which has worsened over the last month. Left hip pain. No known injury. EXAM: DG HIP (WITH OR WITHOUT PELVIS) 2-3V LEFT COMPARISON:  None. FINDINGS: There is no evidence of hip fracture or dislocation. There is no evidence of arthropathy or other focal bone abnormality. IMPRESSION: Negative exam. Electronically Signed   By: Inge Rise M.D.   On: 12/12/2018 19:58   Vas [place] Lower Extremity Venous (dvt)  Result Date: 12/14/2018  Lower Venous Study Indications: Swelling, and Edema.  Performing Technologist: Abram Sander RVS  Examination Guidelines: A  complete evaluation includes B-mode imaging, spectral Doppler, color Doppler, and power Doppler as needed of all accessible portions of each vessel. Bilateral testing is considered an integral part of a complete examination. Limited examinations for reoccurring indications may be performed as noted.  Right Venous Findings: +---------+---------------+---------+-----------+----------+-------+          CompressibilityPhasicitySpontaneityPropertiesSummary +---------+---------------+---------+-----------+----------+-------+ CFV      Full           Yes      Yes                          +---------+---------------+---------+-----------+----------+-------+ SFJ      Full                                                 +---------+---------------+---------+-----------+----------+-------+ FV Prox  Full                                                 +---------+---------------+---------+-----------+----------+-------+ FV Mid   Full                                                 +---------+---------------+---------+-----------+----------+-------+ FV DistalFull                                                 +---------+---------------+---------+-----------+----------+-------+ PFV      Full                                                 +---------+---------------+---------+-----------+----------+-------+ POP      Full           Yes      Yes                          +---------+---------------+---------+-----------+----------+-------+ PTV      Full                                                 +---------+---------------+---------+-----------+----------+-------+  PERO     Full                                                 +---------+---------------+---------+-----------+----------+-------+  Left Venous Findings: +---+---------------+---------+-----------+----------+-------+    CompressibilityPhasicitySpontaneityPropertiesSummary  +---+---------------+---------+-----------+----------+-------+ CFVFull           Yes      Yes                          +---+---------------+---------+-----------+----------+-------+    Summary: Right: There is no evidence of deep vein thrombosis in the lower extremity. No cystic structure found in the popliteal fossa. Left: No evidence of common femoral vein obstruction.  *See table(s) above for measurements and observations. Electronically signed by Harold Barban MD on 12/14/2018 at 8:00:01 AM.    Final     Microbiology: Recent Results (from the past 240 hour(s))  Urine culture     Status: None   Collection Time: 12/12/18  8:05 PM  Result Value Ref Range Status   Specimen Description   Final    URINE, RANDOM Performed at [facility], [place] [address], [place], [place] [address]    Special Requests   Final    NONE Performed at [facility], [place] [address] [place], [place] [address]    Culture   Final    NO GROWTH Performed at [facility], [place] [address], [place], [place] [address]    Report Status 12/14/2018 FINAL  Final     Labs: Basic Metabolic Panel: Recent Labs  Lab 12/12/18 1757 12/14/18 0522 12/15/18 0440  NA 136 140 140  K 4.2 4.0 3.8  CL 103 112* 111  CO2 26 21* 21*  GLUCOSE 152* 127* 128*  BUN 15 12 12   CREATININE 0.80 0.73 0.63  CALCIUM 9.0 8.2* 8.4*   Liver Function Tests: Recent Labs  Lab 12/12/18 1757  AST 20  ALT 12  ALKPHOS 67  BILITOT 0.5  PROT 7.0  ALBUMIN 3.7   No results for input(s): LIPASE, AMYLASE in the last 168 hours. No results for input(s): AMMONIA in the last 168 hours. CBC: Recent Labs  Lab 12/12/18 1757 12/14/18 0522  WBC 8.2 7.2  NEUTROABS 6.4  --   HGB 12.6* 10.9*  HCT 40.3 35.5*  MCV 94.6 95.7  PLT 228 193   Cardiac Enzymes: No results for input(s): CKTOTAL, CKMB, CKMBINDEX, TROPONINI in the last 168 hours. BNP: BNP (last 3 results) No results for input(s): BNP in the  last 8760 hours.  ProBNP (last 3 results) No results for input(s): PROBNP in the last 8760 hours.  CBG: Recent Labs  Lab 12/14/18 1132 12/14/18 1654 12/14/18 2122 12/15/18 0758 12/15/18 1226  GLUCAP 136* 153* 107* 133* 163*       Signed:  Irine Seal MD.  [facility] 12/15/2018, 1:51 PM

## 2018-12-15 NOTE — Progress Notes (Signed)
Discharge instructions given to pt and all questions were answered.  

## 2018-12-15 NOTE — TOC Transition Note (Signed)
Transition of Care [facility]) - CM/SW Discharge Note   Patient Details  Name: Matthew Velez MRN: [id number] Date of Birth: Jun 24, 1919  [facility] CM/SW Contact:  Nila Nephew, LCSW Phone Number: [phone] 12/15/2018, 3:13 PM   Clinical Narrative:   Pt admitting to [facility] SNF- report [id number] ptar transportation arranged Pt's wife aware. Unable to visit at SNF therefore will call pt at facility later. [employer] medicare authorization received at SNF      Barriers to Discharge: no barriers   Patient Goals and CMS Choice Patient states their goals for this hospitalization and ongoing recovery are:: Return to independent living with spouse. CMS Medicare.gov Compare Post Acute Care list provided to:: (Patietn going to SNF on [address] as his independent living housing is. ) Choice offered to / list presented to : NA  Discharge Placement             [facility]          Discharge Plan and Services                          Social Determinants of Health (SDOH) Interventions     Readmission Risk Interventions Readmission Risk Prevention Plan 12/13/2018  Post Dischage Appt Complete  Medication Screening Complete  Transportation Screening Complete  Some recent data might be hidden

## 2018-12-17 DIAGNOSIS — I10 Essential (primary) hypertension: Secondary | ICD-10-CM | POA: Diagnosis not present

## 2018-12-17 DIAGNOSIS — K5909 Other constipation: Secondary | ICD-10-CM | POA: Diagnosis not present

## 2018-12-17 DIAGNOSIS — M6281 Muscle weakness (generalized): Secondary | ICD-10-CM | POA: Diagnosis not present

## 2018-12-17 DIAGNOSIS — R269 Unspecified abnormalities of gait and mobility: Secondary | ICD-10-CM | POA: Diagnosis not present

## 2018-12-17 DIAGNOSIS — K219 Gastro-esophageal reflux disease without esophagitis: Secondary | ICD-10-CM | POA: Diagnosis not present

## 2018-12-17 DIAGNOSIS — I480 Paroxysmal atrial fibrillation: Secondary | ICD-10-CM | POA: Diagnosis not present

## 2018-12-17 DIAGNOSIS — E119 Type 2 diabetes mellitus without complications: Secondary | ICD-10-CM | POA: Diagnosis not present

## 2018-12-18 DIAGNOSIS — M6281 Muscle weakness (generalized): Secondary | ICD-10-CM | POA: Diagnosis not present

## 2018-12-18 DIAGNOSIS — I480 Paroxysmal atrial fibrillation: Secondary | ICD-10-CM | POA: Diagnosis not present

## 2018-12-18 DIAGNOSIS — K219 Gastro-esophageal reflux disease without esophagitis: Secondary | ICD-10-CM | POA: Diagnosis not present

## 2018-12-18 DIAGNOSIS — M150 Primary generalized (osteo)arthritis: Secondary | ICD-10-CM | POA: Diagnosis not present

## 2018-12-18 DIAGNOSIS — Z86711 Personal history of pulmonary embolism: Secondary | ICD-10-CM | POA: Diagnosis not present

## 2018-12-18 DIAGNOSIS — I10 Essential (primary) hypertension: Secondary | ICD-10-CM | POA: Diagnosis not present

## 2018-12-18 DIAGNOSIS — E1140 Type 2 diabetes mellitus with diabetic neuropathy, unspecified: Secondary | ICD-10-CM | POA: Diagnosis not present

## 2018-12-18 DIAGNOSIS — I509 Heart failure, unspecified: Secondary | ICD-10-CM | POA: Diagnosis not present

## 2018-12-19 DIAGNOSIS — M6281 Muscle weakness (generalized): Secondary | ICD-10-CM | POA: Diagnosis not present

## 2018-12-19 DIAGNOSIS — I10 Essential (primary) hypertension: Secondary | ICD-10-CM | POA: Diagnosis not present

## 2018-12-19 DIAGNOSIS — R269 Unspecified abnormalities of gait and mobility: Secondary | ICD-10-CM | POA: Diagnosis not present

## 2018-12-19 DIAGNOSIS — K5909 Other constipation: Secondary | ICD-10-CM | POA: Diagnosis not present

## 2018-12-19 DIAGNOSIS — K219 Gastro-esophageal reflux disease without esophagitis: Secondary | ICD-10-CM | POA: Diagnosis not present

## 2018-12-19 DIAGNOSIS — E119 Type 2 diabetes mellitus without complications: Secondary | ICD-10-CM | POA: Diagnosis not present

## 2018-12-19 DIAGNOSIS — H4010X4 Unspecified open-angle glaucoma, indeterminate stage: Secondary | ICD-10-CM | POA: Diagnosis not present

## 2018-12-19 DIAGNOSIS — I480 Paroxysmal atrial fibrillation: Secondary | ICD-10-CM | POA: Diagnosis not present

## 2018-12-25 ENCOUNTER — Encounter: Payer: Self-pay | Admitting: *Deleted

## 2018-12-25 ENCOUNTER — Other Ambulatory Visit: Payer: Self-pay | Admitting: *Deleted

## 2018-12-25 DIAGNOSIS — I4891 Unspecified atrial fibrillation: Secondary | ICD-10-CM | POA: Diagnosis not present

## 2018-12-25 DIAGNOSIS — E1142 Type 2 diabetes mellitus with diabetic polyneuropathy: Secondary | ICD-10-CM | POA: Diagnosis not present

## 2018-12-25 DIAGNOSIS — R600 Localized edema: Secondary | ICD-10-CM | POA: Diagnosis not present

## 2018-12-25 DIAGNOSIS — I10 Essential (primary) hypertension: Secondary | ICD-10-CM | POA: Diagnosis not present

## 2018-12-25 NOTE — Patient Outreach (Addendum)
[facility]) Care Management  12/25/2018  AYLEN RAMBERT [birth year] [id number]   Subjective: Telephone call to patient's home number, spoke with patient, HIPAA verified, states he has trouble hearing on the phone and requested RNCM speak with his wife. Spoke with patient,  gave RNCM consent to speak with wife [name]) regarding his healthcare needs as needed. Wife stated patient's name, date of birth, and address. Discussed [facility] Transition of care follow up, wife voiced understanding, and is in agreement to follow up on patient's behalf.  Wife states patient is feeling good, except leg still swollen, spoke with primary MD today, MD aware gave recommendations to decrease leg swelling, and patient to call MD on 12/29/2018 with an update.  Wife states patient  is able to manage self care and has assistance as needed (facility has nurses available as needed).  Wife voices understanding of medical diagnosis and treatment plan. Wife states patient does not have any education material, transition of care, care coordination, disease management, disease monitoring, transportation, community resource, or pharmacy needs at this time.  States he is very appreciative of the follow up and is in agreement to receive [place] Management information.     Objective: Per KPN (Knowledge Performance Now, point of care tool) and chart review, patient hospitalized 12/12/2018 -12/15/2018 for UTI, Saddle pulmonary embolus, Right leg DVT.   Patient inpatient 12/15/2018 -  12/22/2018 at [facility] and [facility] for rehab.   Patient also has a history of Atrial fibrillation, transient, and diabetes.          Assessment: Received [employer] Transition of care follow up referral on 12/24/2018.  Transition of care follow up completed, no care management needs, and will proceed with case closure.      Plan: RNCM will send patient successful outreach letter, [facility] pamphlet, and  magnet. RNCM will complete case closure due to follow up completed / no care management needs.  RNCM will send MD case closure letter.      Natascha Edmonds H. Annia Friendly, BSN, [facility] Telephonic CM Phone: [phone] Fax: [phone]

## 2019-01-01 DIAGNOSIS — Z79899 Other long term (current) drug therapy: Secondary | ICD-10-CM | POA: Diagnosis not present

## 2019-02-10 DIAGNOSIS — L853 Xerosis cutis: Secondary | ICD-10-CM | POA: Diagnosis not present

## 2019-02-10 DIAGNOSIS — M79671 Pain in right foot: Secondary | ICD-10-CM | POA: Diagnosis not present

## 2019-02-10 DIAGNOSIS — I878 Other specified disorders of veins: Secondary | ICD-10-CM | POA: Diagnosis not present

## 2019-02-19 DIAGNOSIS — N390 Urinary tract infection, site not specified: Secondary | ICD-10-CM | POA: Diagnosis not present

## 2019-02-19 DIAGNOSIS — R319 Hematuria, unspecified: Secondary | ICD-10-CM | POA: Diagnosis not present

## 2019-05-12 DIAGNOSIS — C61 Malignant neoplasm of prostate: Secondary | ICD-10-CM | POA: Diagnosis not present

## 2019-05-19 DIAGNOSIS — Z5111 Encounter for antineoplastic chemotherapy: Secondary | ICD-10-CM | POA: Diagnosis not present

## 2019-05-19 DIAGNOSIS — C7951 Secondary malignant neoplasm of bone: Secondary | ICD-10-CM | POA: Diagnosis not present

## 2019-05-19 DIAGNOSIS — R310 Gross hematuria: Secondary | ICD-10-CM | POA: Diagnosis not present

## 2019-05-19 DIAGNOSIS — C61 Malignant neoplasm of prostate: Secondary | ICD-10-CM | POA: Diagnosis not present

## 2019-05-26 DIAGNOSIS — Z961 Presence of intraocular lens: Secondary | ICD-10-CM | POA: Diagnosis not present

## 2019-05-26 DIAGNOSIS — H401132 Primary open-angle glaucoma, bilateral, moderate stage: Secondary | ICD-10-CM | POA: Diagnosis not present

## 2019-05-26 DIAGNOSIS — D3132 Benign neoplasm of left choroid: Secondary | ICD-10-CM | POA: Diagnosis not present

## 2019-07-02 DIAGNOSIS — E1142 Type 2 diabetes mellitus with diabetic polyneuropathy: Secondary | ICD-10-CM | POA: Diagnosis not present

## 2019-07-02 DIAGNOSIS — C61 Malignant neoplasm of prostate: Secondary | ICD-10-CM | POA: Diagnosis not present

## 2019-07-02 DIAGNOSIS — Z0000 Encounter for general adult medical examination without abnormal findings: Secondary | ICD-10-CM | POA: Diagnosis not present

## 2019-07-02 DIAGNOSIS — Z1389 Encounter for screening for other disorder: Secondary | ICD-10-CM | POA: Diagnosis not present

## 2019-07-02 DIAGNOSIS — I10 Essential (primary) hypertension: Secondary | ICD-10-CM | POA: Diagnosis not present

## 2019-07-02 DIAGNOSIS — I4891 Unspecified atrial fibrillation: Secondary | ICD-10-CM | POA: Diagnosis not present

## 2019-07-02 DIAGNOSIS — E1165 Type 2 diabetes mellitus with hyperglycemia: Secondary | ICD-10-CM | POA: Diagnosis not present

## 2019-07-02 DIAGNOSIS — Z79899 Other long term (current) drug therapy: Secondary | ICD-10-CM | POA: Diagnosis not present

## 2019-07-02 DIAGNOSIS — I5032 Chronic diastolic (congestive) heart failure: Secondary | ICD-10-CM | POA: Diagnosis not present

## 2019-11-16 DIAGNOSIS — C61 Malignant neoplasm of prostate: Secondary | ICD-10-CM | POA: Diagnosis not present

## 2019-11-17 DIAGNOSIS — Z7189 Other specified counseling: Secondary | ICD-10-CM | POA: Diagnosis not present

## 2019-11-17 DIAGNOSIS — I10 Essential (primary) hypertension: Secondary | ICD-10-CM | POA: Diagnosis not present

## 2019-11-23 DIAGNOSIS — R310 Gross hematuria: Secondary | ICD-10-CM | POA: Diagnosis not present

## 2019-11-23 DIAGNOSIS — C7951 Secondary malignant neoplasm of bone: Secondary | ICD-10-CM | POA: Diagnosis not present

## 2019-11-23 DIAGNOSIS — C61 Malignant neoplasm of prostate: Secondary | ICD-10-CM | POA: Diagnosis not present

## 2019-12-31 DIAGNOSIS — I10 Essential (primary) hypertension: Secondary | ICD-10-CM | POA: Diagnosis not present

## 2019-12-31 DIAGNOSIS — I5032 Chronic diastolic (congestive) heart failure: Secondary | ICD-10-CM | POA: Diagnosis not present

## 2019-12-31 DIAGNOSIS — M79645 Pain in left finger(s): Secondary | ICD-10-CM | POA: Diagnosis not present

## 2019-12-31 DIAGNOSIS — E1142 Type 2 diabetes mellitus with diabetic polyneuropathy: Secondary | ICD-10-CM | POA: Diagnosis not present

## 2019-12-31 DIAGNOSIS — D649 Anemia, unspecified: Secondary | ICD-10-CM | POA: Diagnosis not present

## 2019-12-31 DIAGNOSIS — I4891 Unspecified atrial fibrillation: Secondary | ICD-10-CM | POA: Diagnosis not present

## 2019-12-31 DIAGNOSIS — Z79899 Other long term (current) drug therapy: Secondary | ICD-10-CM | POA: Diagnosis not present

## 2019-12-31 DIAGNOSIS — M720 Palmar fascial fibromatosis [Dupuytren]: Secondary | ICD-10-CM | POA: Diagnosis not present

## 2019-12-31 DIAGNOSIS — D6869 Other thrombophilia: Secondary | ICD-10-CM | POA: Diagnosis not present

## 2019-12-31 DIAGNOSIS — M65341 Trigger finger, right ring finger: Secondary | ICD-10-CM | POA: Diagnosis not present

## 2020-05-31 DIAGNOSIS — C7951 Secondary malignant neoplasm of bone: Secondary | ICD-10-CM | POA: Diagnosis not present

## 2020-05-31 DIAGNOSIS — R310 Gross hematuria: Secondary | ICD-10-CM | POA: Diagnosis not present

## 2020-05-31 DIAGNOSIS — C61 Malignant neoplasm of prostate: Secondary | ICD-10-CM | POA: Diagnosis not present

## 2020-05-31 DIAGNOSIS — C775 Secondary and unspecified malignant neoplasm of intrapelvic lymph nodes: Secondary | ICD-10-CM | POA: Diagnosis not present

## 2020-07-14 DIAGNOSIS — Z7984 Long term (current) use of oral hypoglycemic drugs: Secondary | ICD-10-CM | POA: Diagnosis not present

## 2020-07-14 DIAGNOSIS — I5032 Chronic diastolic (congestive) heart failure: Secondary | ICD-10-CM | POA: Diagnosis not present

## 2020-07-14 DIAGNOSIS — I110 Hypertensive heart disease with heart failure: Secondary | ICD-10-CM | POA: Diagnosis not present

## 2020-07-14 DIAGNOSIS — C61 Malignant neoplasm of prostate: Secondary | ICD-10-CM | POA: Diagnosis not present

## 2020-07-14 DIAGNOSIS — I10 Essential (primary) hypertension: Secondary | ICD-10-CM | POA: Diagnosis not present

## 2020-07-14 DIAGNOSIS — C7951 Secondary malignant neoplasm of bone: Secondary | ICD-10-CM | POA: Diagnosis not present

## 2020-07-14 DIAGNOSIS — Z79899 Other long term (current) drug therapy: Secondary | ICD-10-CM | POA: Diagnosis not present

## 2020-07-14 DIAGNOSIS — Z1389 Encounter for screening for other disorder: Secondary | ICD-10-CM | POA: Diagnosis not present

## 2020-07-14 DIAGNOSIS — K9089 Other intestinal malabsorption: Secondary | ICD-10-CM | POA: Diagnosis not present

## 2020-07-14 DIAGNOSIS — I4891 Unspecified atrial fibrillation: Secondary | ICD-10-CM | POA: Diagnosis not present

## 2020-07-14 DIAGNOSIS — D6869 Other thrombophilia: Secondary | ICD-10-CM | POA: Diagnosis not present

## 2020-07-14 DIAGNOSIS — K219 Gastro-esophageal reflux disease without esophagitis: Secondary | ICD-10-CM | POA: Diagnosis not present

## 2020-07-14 DIAGNOSIS — E1142 Type 2 diabetes mellitus with diabetic polyneuropathy: Secondary | ICD-10-CM | POA: Diagnosis not present

## 2020-07-14 DIAGNOSIS — Z0000 Encounter for general adult medical examination without abnormal findings: Secondary | ICD-10-CM | POA: Diagnosis not present

## 2020-07-15 DIAGNOSIS — E119 Type 2 diabetes mellitus without complications: Secondary | ICD-10-CM | POA: Diagnosis not present

## 2020-07-15 DIAGNOSIS — H52203 Unspecified astigmatism, bilateral: Secondary | ICD-10-CM | POA: Diagnosis not present

## 2020-07-15 DIAGNOSIS — H401132 Primary open-angle glaucoma, bilateral, moderate stage: Secondary | ICD-10-CM | POA: Diagnosis not present

## 2020-07-15 DIAGNOSIS — D3132 Benign neoplasm of left choroid: Secondary | ICD-10-CM | POA: Diagnosis not present

## 2020-07-27 DIAGNOSIS — I10 Essential (primary) hypertension: Secondary | ICD-10-CM | POA: Diagnosis not present

## 2020-07-27 DIAGNOSIS — M549 Dorsalgia, unspecified: Secondary | ICD-10-CM | POA: Diagnosis not present

## 2020-08-03 ENCOUNTER — Other Ambulatory Visit: Payer: Self-pay

## 2020-08-03 ENCOUNTER — Encounter (HOSPITAL_COMMUNITY): Payer: Self-pay | Admitting: Emergency Medicine

## 2020-08-03 ENCOUNTER — Emergency Department (HOSPITAL_COMMUNITY): Payer: Medicare HMO

## 2020-08-03 ENCOUNTER — Emergency Department (HOSPITAL_COMMUNITY)
Admission: EM | Admit: 2020-08-03 | Discharge: 2020-08-03 | Disposition: A | Discharge status: Home / Self Care | Payer: Medicare HMO | Attending: Emergency Medicine | Admitting: Emergency Medicine

## 2020-08-03 DIAGNOSIS — E119 Type 2 diabetes mellitus without complications: Secondary | ICD-10-CM | POA: Insufficient documentation

## 2020-08-03 DIAGNOSIS — Y9301 Activity, walking, marching and hiking: Secondary | ICD-10-CM | POA: Insufficient documentation

## 2020-08-03 DIAGNOSIS — Y92009 Unspecified place in unspecified non-institutional (private) residence as the place of occurrence of the external cause: Secondary | ICD-10-CM | POA: Insufficient documentation

## 2020-08-03 DIAGNOSIS — Z86711 Personal history of pulmonary embolism: Secondary | ICD-10-CM | POA: Insufficient documentation

## 2020-08-03 DIAGNOSIS — Z7984 Long term (current) use of oral hypoglycemic drugs: Secondary | ICD-10-CM | POA: Insufficient documentation

## 2020-08-03 DIAGNOSIS — W1839XA Other fall on same level, initial encounter: Secondary | ICD-10-CM | POA: Insufficient documentation

## 2020-08-03 DIAGNOSIS — R279 Unspecified lack of coordination: Secondary | ICD-10-CM | POA: Diagnosis not present

## 2020-08-03 DIAGNOSIS — M47812 Spondylosis without myelopathy or radiculopathy, cervical region: Secondary | ICD-10-CM | POA: Diagnosis not present

## 2020-08-03 DIAGNOSIS — Z79899 Other long term (current) drug therapy: Secondary | ICD-10-CM | POA: Insufficient documentation

## 2020-08-03 DIAGNOSIS — S0180XA Unspecified open wound of other part of head, initial encounter: Secondary | ICD-10-CM | POA: Insufficient documentation

## 2020-08-03 DIAGNOSIS — Y999 Unspecified external cause status: Secondary | ICD-10-CM | POA: Diagnosis not present

## 2020-08-03 DIAGNOSIS — W19XXXA Unspecified fall, initial encounter: Secondary | ICD-10-CM

## 2020-08-03 DIAGNOSIS — Z743 Need for continuous supervision: Secondary | ICD-10-CM | POA: Diagnosis not present

## 2020-08-03 DIAGNOSIS — I6782 Cerebral ischemia: Secondary | ICD-10-CM | POA: Diagnosis not present

## 2020-08-03 DIAGNOSIS — I6381 Other cerebral infarction due to occlusion or stenosis of small artery: Secondary | ICD-10-CM | POA: Diagnosis not present

## 2020-08-03 DIAGNOSIS — R531 Weakness: Secondary | ICD-10-CM | POA: Diagnosis not present

## 2020-08-03 DIAGNOSIS — G319 Degenerative disease of nervous system, unspecified: Secondary | ICD-10-CM | POA: Diagnosis not present

## 2020-08-03 DIAGNOSIS — S0990XA Unspecified injury of head, initial encounter: Secondary | ICD-10-CM

## 2020-08-03 LAB — BASIC METABOLIC PANEL
Anion gap: 11 (ref 5–15)
BUN: 14 mg/dL (ref 8–23)
CO2: 23 mmol/L (ref 22–32)
Calcium: 9 mg/dL (ref 8.9–10.3)
Chloride: 99 mmol/L (ref 98–111)
Creatinine, Ser: 0.99 mg/dL (ref 0.61–1.24)
GFR, Estimated: >60 mL/min (ref >60)
Glucose, Bld: 135 mg/dL — ABNORMAL HIGH (ref 70–99)
Potassium: 4 mmol/L (ref 3.5–5.1)
Sodium: 133 mmol/L — ABNORMAL LOW (ref 135–145)

## 2020-08-03 LAB — CBC WITH DIFFERENTIAL/PLATELET
Abs Immature Granulocytes: 0.06 10*3/uL (ref 0.00–0.07)
Basophils Absolute: 0.1 10*3/uL (ref 0.0–0.1)
Basophils Relative: 1 %
Eosinophils Absolute: 0.2 10*3/uL (ref 0.0–0.5)
Eosinophils Relative: 2 %
HCT: 41.1 % (ref 39.0–52.0)
Hemoglobin: 13.3 g/dL (ref 13.0–17.0)
Immature Granulocytes: 1 %
Lymphocytes Relative: 16 %
Lymphs Abs: 1.4 10*3/uL (ref 0.7–4.0)
MCH: 30 pg (ref 26.0–34.0)
MCHC: 32.4 g/dL (ref 30.0–36.0)
MCV: 92.6 fL (ref 80.0–100.0)
Monocytes Absolute: 1 10*3/uL (ref 0.1–1.0)
Monocytes Relative: 11 %
Neutro Abs: 5.7 10*3/uL (ref 1.7–7.7)
Neutrophils Relative %: 69 %
Platelets: 236 10*3/uL (ref 150–400)
RBC: 4.44 MIL/uL (ref 4.22–5.81)
RDW: 13.9 % (ref 11.5–15.5)
WBC: 8.4 10*3/uL (ref 4.0–10.5)
nRBC: 0 % (ref 0.0–0.2)

## 2020-08-03 NOTE — ED Notes (Signed)
Ptar stated pt is number 5 on the list

## 2020-08-03 NOTE — ED Triage Notes (Signed)
Pt arrives via [facility] from [facility] s/p fall on blood thinners.  Swelling noted to R forehead, bandage in place, bleeding controlled at this time.  Pt denies dizziness, CP, LOC, reports mechanical fall on carpet.

## 2020-08-03 NOTE — ED Provider Notes (Signed)
[facility] EMERGENCY DEPARTMENT Provider Note   CSN: [id number] Arrival date & time: 08/03/20  1241     History Chief Complaint  Patient presents with  . Fall    Matthew Velez is a 86 y.o. male.  He has a history of A. fib and is on anticoagulation.  Also history of PE.  He is coming by EMS from [facility] after he fell ambulating in his room.  It sounds like he turned too quickly lost his balance and fell to the ground striking his head.  No loss of consciousness.  He denies any complaints other than a wound on his head.  No headache chest pain shortness of breath abdominal pain neck or back pain numbness or weakness.  The history is provided by the patient and the EMS personnel.  Fall This is a new problem. The current episode started 1 to 2 hours ago. The problem has not changed since onset.Pertinent negatives include no chest pain, no abdominal pain, no headaches and no shortness of breath. Nothing aggravates the symptoms. Nothing relieves the symptoms. He has tried nothing for the symptoms. The treatment provided no relief.       Past Medical History:  Diagnosis Date  . Atrial fibrillation, transient 02/19/2013  . Benign hypertension 02/19/2013  . Diastolic dysfunction 2/72/5366  . DM type 2 (diabetes mellitus, type 2) ([place]) 02/19/2013  . Prostate cancer ([place]) 02/19/2013  . Right leg DVT ([place]) 02/19/2013  . 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 Bilateral leg weakness 12/13/2018  . UTI (urinary tract infection) 12/12/2018  . [place]addle pulmonary embolus ([place]) 02/19/2013  . Atrial fibrillation, transient 02/19/2013  . Right leg DVT ([place]) 02/19/2013  . DM type 2 (diabetes mellitus, type 2) ([place]) 02/19/2013  . Prostate cancer ([place]) 02/19/2013  . Benign hypertension 02/19/2013  . Diastolic dysfunction 44/11/4740    No past surgical history on file.     Family History  Problem Relation Age of  Onset  . Colon cancer Mother   . Stroke Father     Social History   Tobacco Use  . Smoking status: Never Smoker  . Smokeless tobacco: Never Used  Vaping Use  . Vaping Use: Never used  Substance Use Topics  . Alcohol use: Not Currently  . Drug use: Never    Home Medications Prior to Admission medications   Medication Sig Start Date End Date Taking? Authorizing Provider  Cranberry 300 MG tablet Take 300 mg by mouth daily.    [provider]  furosemide (LASIX) 40 MG tablet Take 1 tablet (40 mg total) by mouth daily. 12/16/18   Eugenie Filler, MD  glipiZIDE (GLUCOTROL) 5 MG tablet Take 5 mg by mouth daily.     [provider]  Glucosamine Sulfate 500 MG TABS Take 1 tablet by mouth daily.    [provider]  lisinopril (PRINIVIL,ZESTRIL) 10 MG tablet Take 1 tablet (10 mg total) by mouth daily. 12/17/18   Eugenie Filler, MD  omeprazole (PRILOSEC) 20 MG capsule Take 20 mg by mouth 2 (two) times daily.     [provider]  Rivaroxaban (XARELTO) 20 MG TABS Take 1 tablet (20 mg total) by mouth daily. With lunch (with food) Patient taking differently: Take 20 mg by mouth every other day.  02/26/13   Mendel Corning, MD    Allergies    Chocolate, Fish allergy, Other, and Iodine  Review of Systems   Review of Systems  Constitutional: Negative for fever.  HENT: Positive for hearing loss. Negative for sore throat.   Eyes: Negative for visual disturbance.  Respiratory: Negative for shortness of breath.   Cardiovascular: Negative for chest pain.  Gastrointestinal: Negative for abdominal pain.  Genitourinary: Negative for dysuria.  Musculoskeletal: Negative for neck pain.  Skin: Positive for wound. Negative for rash.  Neurological: Negative for headaches.    Physical Exam Updated Vital Signs BP (!) 141/71 (BP Location: Left Arm)   Pulse 97   Temp 98.4 F (36.9 C) (Oral)   Resp 14   Ht 5\' 5"  (1.651 m)   Wt 75.8 kg   SpO2 98%   BMI 27.79  kg/m   Physical Exam Vitals and nursing note reviewed.  Constitutional:      Appearance: Normal appearance. He is well-developed.  HENT:     Head: Normocephalic.     Comments: Approximate 3 cm circular skin avulsion on his frontal scalp. Eyes:     Conjunctiva/sclera: Conjunctivae normal.  Cardiovascular:     Rate and Rhythm: Normal rate and regular rhythm.     Heart sounds: No murmur heard.   Pulmonary:     Effort: Pulmonary effort is normal. No respiratory distress.     Breath sounds: Normal breath sounds.  Abdominal:     Palpations: Abdomen is soft.     Tenderness: There is no abdominal tenderness.  Musculoskeletal:        General: No swelling, tenderness or deformity. Normal range of motion.     Cervical back: Neck supple.  Skin:    General: Skin is warm and dry.  Neurological:     General: No focal deficit present.     Mental Status: He is alert.     ED Results / Procedures / Treatments   Labs (all labs ordered are listed, but only abnormal results are displayed) Labs Reviewed  BASIC METABOLIC PANEL - Abnormal; Notable for the following components:      Result Value   Sodium 133 (*)    Glucose, Bld 135 (*)    All other components within normal limits  CBC WITH DIFFERENTIAL/PLATELET    EKG None  Radiology CT Head Wo Contrast  Result Date: 08/03/2020 CLINICAL DATA:  Status post fall EXAM: CT HEAD WITHOUT CONTRAST CT CERVICAL SPINE WITHOUT CONTRAST TECHNIQUE: Multidetector CT imaging of the head and cervical spine was performed following the standard protocol without intravenous contrast. Multiplanar CT image reconstructions of the cervical spine were also generated. COMPARISON:  None. FINDINGS: CT HEAD FINDINGS Brain: No evidence of acute infarction, hemorrhage, hydrocephalus, extra-axial collection or mass lesion/mass effect. There is mild diffuse low-attenuation within the subcortical and periventricular white matter compatible with chronic microvascular  disease. Remote right basal ganglia lacunar infarct noted, image 15/4. Prominence of the sulci and ventricles are identified consistent with brain atrophy. Vascular: No hyperdense vessel or unexpected calcification. Skull: Normal. Negative for fracture or focal lesion. Sinuses/Orbits: Paranasal sinuses are clear. Mastoid air cells clear. Other: None CT CERVICAL SPINE FINDINGS Alignment: Normal. Skull base and vertebrae: The vertebral body heights are well preserved. Soft tissues and spinal canal: No prevertebral fluid or swelling. No visible canal hematoma. Disc levels: There is multi level disc space narrowing and ventral endplate spurring. Most advanced at C5-6 and C6-7. Bilateral facet hypertrophy and degenerative change. Upper chest: Negative. Other: None IMPRESSION: 1. No acute intracranial abnormalities. 2. Chronic small vessel ischemic change and brain atrophy. 3. No evidence  for cervical spine fracture. 4. Cervical spondylosis. Electronically Signed   By: Kerby Moors M.D.   On: 08/03/2020 16:42   CT Cervical Spine Wo Contrast  Result Date: 08/03/2020 CLINICAL DATA:  Status post fall EXAM: CT HEAD WITHOUT CONTRAST CT CERVICAL SPINE WITHOUT CONTRAST TECHNIQUE: Multidetector CT imaging of the head and cervical spine was performed following the standard protocol without intravenous contrast. Multiplanar CT image reconstructions of the cervical spine were also generated. COMPARISON:  None. FINDINGS: CT HEAD FINDINGS Brain: No evidence of acute infarction, hemorrhage, hydrocephalus, extra-axial collection or mass lesion/mass effect. There is mild diffuse low-attenuation within the subcortical and periventricular white matter compatible with chronic microvascular disease. Remote right basal ganglia lacunar infarct noted, image 15/4. Prominence of the sulci and ventricles are identified consistent with brain atrophy. Vascular: No hyperdense vessel or unexpected calcification. Skull: Normal. Negative for  fracture or focal lesion. Sinuses/Orbits: Paranasal sinuses are clear. Mastoid air cells clear. Other: None CT CERVICAL SPINE FINDINGS Alignment: Normal. Skull base and vertebrae: The vertebral body heights are well preserved. Soft tissues and spinal canal: No prevertebral fluid or swelling. No visible canal hematoma. Disc levels: There is multi level disc space narrowing and ventral endplate spurring. Most advanced at C5-6 and C6-7. Bilateral facet hypertrophy and degenerative change. Upper chest: Negative. Other: None IMPRESSION: 1. No acute intracranial abnormalities. 2. Chronic small vessel ischemic change and brain atrophy. 3. No evidence for cervical spine fracture. 4. Cervical spondylosis. Electronically Signed   By: Kerby Moors M.D.   On: 08/03/2020 16:42    Procedures Procedures (including critical care time)  Medications Ordered in ED Medications - No data to display  ED Course  I have reviewed the triage vital signs and the nursing notes.  Pertinent labs & imaging results that were available during my care of the patient were reviewed by me and considered in my medical decision making (see chart for details).    MDM Rules/Calculators/A&P                         Level 2 trauma activation for head injury after fall on blood thinners.  Patient awake and alert.  Denies any complaints.  Fall sounds mechanical.  Does have skin avulsion forehead that does not lend itself to repair.  Due to concern for intracranial injury and possible cervical spine fracture ordered CT head and cervical spine that did not show any acute findings.  Patient is moving all extremities in the department without any pain or limitations.  Signed out to oncoming provider to following up on final reading from radiology and if negative can disposition back to his facility.  Final Clinical Impression(s) / ED Diagnoses Final diagnoses:  Avulsion of skin of face, initial encounter  Injury of head, initial encounter    Fall in home, initial encounter    Rx / DC Orders ED Discharge Orders    None       Hayden Rasmussen, MD 08/03/20 [phone]

## 2020-08-03 NOTE — ED Provider Notes (Signed)
  Physical Exam  BP 134/65   Pulse 92   Temp 98.1 F (36.7 C) (Oral)   Resp 15   Ht 5\' 5"  (1.651 m)   Wt 75.8 kg   SpO2 99%   BMI 27.79 kg/m   Physical Exam  ED Course/Procedures     Procedures  MDM  Patient care assumed at 3 pm. Patient had a mechanical fall and was noted to have abrasion on the right forehead.  Signout pending CT head and neck.  5:08 PM CT unremarkable.  Patient states that he feels fine.  Stable for discharge back home.  Gave strict return precautions.       Drenda Freeze, MD 08/03/20 [phone]

## 2020-08-03 NOTE — Discharge Instructions (Addendum)
You were seen in the emergency department for evaluation of injuries from a fall.  You had a large skin tear on your forehead.  You had a CAT scan of your head and neck that did not show any internal bleeding or fractures.  Please follow-up with your primary care doctor for reevaluation of your wound.  Please also discuss with your primary care doctor whether continuing on anticoagulation is safe for you.  Return to the emergency department if any worsening or concerning symptoms

## 2020-08-03 NOTE — ED Notes (Signed)
Called [facility] for transport.  

## 2020-08-03 NOTE — ED Notes (Signed)
Spoke with pt's wife, informed her we are waiting on [facility] for transport. Pt and wife state they do not have anyone else to come get him.

## 2020-08-04 DIAGNOSIS — E569 Vitamin deficiency, unspecified: Secondary | ICD-10-CM | POA: Diagnosis not present

## 2020-08-05 DIAGNOSIS — J309 Allergic rhinitis, unspecified: Secondary | ICD-10-CM | POA: Diagnosis not present

## 2020-08-05 DIAGNOSIS — M6281 Muscle weakness (generalized): Secondary | ICD-10-CM | POA: Diagnosis not present

## 2020-08-05 DIAGNOSIS — I482 Chronic atrial fibrillation, unspecified: Secondary | ICD-10-CM | POA: Diagnosis not present

## 2020-08-05 DIAGNOSIS — D509 Iron deficiency anemia, unspecified: Secondary | ICD-10-CM | POA: Diagnosis not present

## 2020-08-05 DIAGNOSIS — I10 Essential (primary) hypertension: Secondary | ICD-10-CM | POA: Diagnosis not present

## 2020-08-05 DIAGNOSIS — K219 Gastro-esophageal reflux disease without esophagitis: Secondary | ICD-10-CM | POA: Diagnosis not present

## 2020-08-05 DIAGNOSIS — I2692 Saddle embolus of pulmonary artery without acute cor pulmonale: Secondary | ICD-10-CM | POA: Diagnosis not present

## 2020-08-05 DIAGNOSIS — E569 Vitamin deficiency, unspecified: Secondary | ICD-10-CM | POA: Diagnosis not present

## 2020-08-05 DIAGNOSIS — R609 Edema, unspecified: Secondary | ICD-10-CM | POA: Diagnosis not present

## 2020-08-05 DIAGNOSIS — E119 Type 2 diabetes mellitus without complications: Secondary | ICD-10-CM | POA: Diagnosis not present

## 2020-08-08 DIAGNOSIS — R296 Repeated falls: Secondary | ICD-10-CM | POA: Diagnosis not present

## 2020-08-08 DIAGNOSIS — M6281 Muscle weakness (generalized): Secondary | ICD-10-CM | POA: Diagnosis not present

## 2020-08-08 DIAGNOSIS — E1140 Type 2 diabetes mellitus with diabetic neuropathy, unspecified: Secondary | ICD-10-CM | POA: Diagnosis not present

## 2020-08-08 DIAGNOSIS — I2692 Saddle embolus of pulmonary artery without acute cor pulmonale: Secondary | ICD-10-CM | POA: Diagnosis not present

## 2020-08-08 DIAGNOSIS — I10 Essential (primary) hypertension: Secondary | ICD-10-CM | POA: Diagnosis not present

## 2020-08-08 DIAGNOSIS — J309 Allergic rhinitis, unspecified: Secondary | ICD-10-CM | POA: Diagnosis not present

## 2020-08-08 DIAGNOSIS — E569 Vitamin deficiency, unspecified: Secondary | ICD-10-CM | POA: Diagnosis not present

## 2020-08-08 DIAGNOSIS — C61 Malignant neoplasm of prostate: Secondary | ICD-10-CM | POA: Diagnosis not present

## 2020-08-08 DIAGNOSIS — I482 Chronic atrial fibrillation, unspecified: Secondary | ICD-10-CM | POA: Diagnosis not present

## 2020-08-08 DIAGNOSIS — K219 Gastro-esophageal reflux disease without esophagitis: Secondary | ICD-10-CM | POA: Diagnosis not present

## 2020-08-09 DIAGNOSIS — E569 Vitamin deficiency, unspecified: Secondary | ICD-10-CM | POA: Diagnosis not present

## 2020-08-10 DIAGNOSIS — C61 Malignant neoplasm of prostate: Secondary | ICD-10-CM | POA: Diagnosis not present

## 2020-08-10 DIAGNOSIS — R609 Edema, unspecified: Secondary | ICD-10-CM | POA: Diagnosis not present

## 2020-08-10 DIAGNOSIS — M6281 Muscle weakness (generalized): Secondary | ICD-10-CM | POA: Diagnosis not present

## 2020-08-10 DIAGNOSIS — I2692 Saddle embolus of pulmonary artery without acute cor pulmonale: Secondary | ICD-10-CM | POA: Diagnosis not present

## 2020-08-10 DIAGNOSIS — E569 Vitamin deficiency, unspecified: Secondary | ICD-10-CM | POA: Diagnosis not present

## 2020-08-10 DIAGNOSIS — J309 Allergic rhinitis, unspecified: Secondary | ICD-10-CM | POA: Diagnosis not present

## 2020-08-10 DIAGNOSIS — I482 Chronic atrial fibrillation, unspecified: Secondary | ICD-10-CM | POA: Diagnosis not present

## 2020-08-10 DIAGNOSIS — R296 Repeated falls: Secondary | ICD-10-CM | POA: Diagnosis not present

## 2020-08-10 DIAGNOSIS — K219 Gastro-esophageal reflux disease without esophagitis: Secondary | ICD-10-CM | POA: Diagnosis not present

## 2020-08-10 DIAGNOSIS — E1140 Type 2 diabetes mellitus with diabetic neuropathy, unspecified: Secondary | ICD-10-CM | POA: Diagnosis not present

## 2020-08-11 DIAGNOSIS — E569 Vitamin deficiency, unspecified: Secondary | ICD-10-CM | POA: Diagnosis not present

## 2020-08-12 DIAGNOSIS — E569 Vitamin deficiency, unspecified: Secondary | ICD-10-CM | POA: Diagnosis not present

## 2020-08-15 DIAGNOSIS — E569 Vitamin deficiency, unspecified: Secondary | ICD-10-CM | POA: Diagnosis not present

## 2020-08-16 DIAGNOSIS — E569 Vitamin deficiency, unspecified: Secondary | ICD-10-CM | POA: Diagnosis not present

## 2020-08-17 DIAGNOSIS — I2692 Saddle embolus of pulmonary artery without acute cor pulmonale: Secondary | ICD-10-CM | POA: Diagnosis not present

## 2020-08-17 DIAGNOSIS — J309 Allergic rhinitis, unspecified: Secondary | ICD-10-CM | POA: Diagnosis not present

## 2020-08-17 DIAGNOSIS — R609 Edema, unspecified: Secondary | ICD-10-CM | POA: Diagnosis not present

## 2020-08-17 DIAGNOSIS — K219 Gastro-esophageal reflux disease without esophagitis: Secondary | ICD-10-CM | POA: Diagnosis not present

## 2020-08-17 DIAGNOSIS — I482 Chronic atrial fibrillation, unspecified: Secondary | ICD-10-CM | POA: Diagnosis not present

## 2020-08-17 DIAGNOSIS — E1140 Type 2 diabetes mellitus with diabetic neuropathy, unspecified: Secondary | ICD-10-CM | POA: Diagnosis not present

## 2020-08-17 DIAGNOSIS — C61 Malignant neoplasm of prostate: Secondary | ICD-10-CM | POA: Diagnosis not present

## 2020-08-17 DIAGNOSIS — E569 Vitamin deficiency, unspecified: Secondary | ICD-10-CM | POA: Diagnosis not present

## 2020-08-17 DIAGNOSIS — M6281 Muscle weakness (generalized): Secondary | ICD-10-CM | POA: Diagnosis not present

## 2020-08-17 DIAGNOSIS — R296 Repeated falls: Secondary | ICD-10-CM | POA: Diagnosis not present

## 2020-08-18 DIAGNOSIS — E569 Vitamin deficiency, unspecified: Secondary | ICD-10-CM | POA: Diagnosis not present

## 2020-08-19 DIAGNOSIS — E569 Vitamin deficiency, unspecified: Secondary | ICD-10-CM | POA: Diagnosis not present

## 2020-08-22 DIAGNOSIS — E569 Vitamin deficiency, unspecified: Secondary | ICD-10-CM | POA: Diagnosis not present

## 2020-09-29 DIAGNOSIS — H1033 Unspecified acute conjunctivitis, bilateral: Secondary | ICD-10-CM | POA: Diagnosis not present

## 2020-10-27 DIAGNOSIS — L299 Pruritus, unspecified: Secondary | ICD-10-CM | POA: Diagnosis not present

## 2020-10-27 DIAGNOSIS — H1045 Other chronic allergic conjunctivitis: Secondary | ICD-10-CM | POA: Diagnosis not present

## 2020-10-31 DIAGNOSIS — J029 Acute pharyngitis, unspecified: Secondary | ICD-10-CM | POA: Diagnosis not present

## 2020-10-31 DIAGNOSIS — D649 Anemia, unspecified: Secondary | ICD-10-CM | POA: Diagnosis not present

## 2020-10-31 DIAGNOSIS — R5081 Fever presenting with conditions classified elsewhere: Secondary | ICD-10-CM | POA: Diagnosis not present

## 2020-10-31 DIAGNOSIS — U071 COVID-19: Secondary | ICD-10-CM | POA: Diagnosis not present

## 2020-10-31 DIAGNOSIS — I10 Essential (primary) hypertension: Secondary | ICD-10-CM | POA: Diagnosis not present

## 2020-10-31 DIAGNOSIS — I2510 Atherosclerotic heart disease of native coronary artery without angina pectoris: Secondary | ICD-10-CM | POA: Diagnosis not present

## 2020-10-31 DIAGNOSIS — R519 Headache, unspecified: Secondary | ICD-10-CM | POA: Diagnosis not present

## 2020-11-01 DIAGNOSIS — U071 COVID-19: Secondary | ICD-10-CM | POA: Diagnosis not present

## 2020-11-01 DIAGNOSIS — I2510 Atherosclerotic heart disease of native coronary artery without angina pectoris: Secondary | ICD-10-CM | POA: Diagnosis not present

## 2020-11-01 DIAGNOSIS — I10 Essential (primary) hypertension: Secondary | ICD-10-CM | POA: Diagnosis not present

## 2020-11-11 DIAGNOSIS — E1140 Type 2 diabetes mellitus with diabetic neuropathy, unspecified: Secondary | ICD-10-CM | POA: Diagnosis not present

## 2020-11-11 DIAGNOSIS — Z8616 Personal history of COVID-19: Secondary | ICD-10-CM | POA: Diagnosis not present

## 2020-11-11 DIAGNOSIS — R739 Hyperglycemia, unspecified: Secondary | ICD-10-CM | POA: Diagnosis not present

## 2020-11-18 DIAGNOSIS — H409 Unspecified glaucoma: Secondary | ICD-10-CM | POA: Diagnosis not present

## 2020-11-18 DIAGNOSIS — Z7901 Long term (current) use of anticoagulants: Secondary | ICD-10-CM | POA: Diagnosis not present

## 2020-11-18 DIAGNOSIS — C61 Malignant neoplasm of prostate: Secondary | ICD-10-CM | POA: Diagnosis not present

## 2020-11-18 DIAGNOSIS — K2100 Gastro-esophageal reflux disease with esophagitis, without bleeding: Secondary | ICD-10-CM | POA: Diagnosis not present

## 2020-11-18 DIAGNOSIS — E1140 Type 2 diabetes mellitus with diabetic neuropathy, unspecified: Secondary | ICD-10-CM | POA: Diagnosis not present

## 2020-11-18 DIAGNOSIS — I10 Essential (primary) hypertension: Secondary | ICD-10-CM | POA: Diagnosis not present

## 2020-11-18 DIAGNOSIS — I4891 Unspecified atrial fibrillation: Secondary | ICD-10-CM | POA: Diagnosis not present

## 2020-11-21 DIAGNOSIS — Z79899 Other long term (current) drug therapy: Secondary | ICD-10-CM | POA: Diagnosis not present

## 2020-11-21 DIAGNOSIS — E08319 Diabetes mellitus due to underlying condition with unspecified diabetic retinopathy without macular edema: Secondary | ICD-10-CM | POA: Diagnosis not present

## 2020-11-22 DIAGNOSIS — H1033 Unspecified acute conjunctivitis, bilateral: Secondary | ICD-10-CM | POA: Diagnosis not present

## 2020-11-22 DIAGNOSIS — U071 COVID-19: Secondary | ICD-10-CM | POA: Diagnosis not present

## 2020-11-22 DIAGNOSIS — M6281 Muscle weakness (generalized): Secondary | ICD-10-CM | POA: Diagnosis not present

## 2020-11-22 DIAGNOSIS — H1045 Other chronic allergic conjunctivitis: Secondary | ICD-10-CM | POA: Diagnosis not present

## 2020-11-22 DIAGNOSIS — Z299 Encounter for prophylactic measures, unspecified: Secondary | ICD-10-CM | POA: Diagnosis not present

## 2020-11-22 DIAGNOSIS — E569 Vitamin deficiency, unspecified: Secondary | ICD-10-CM | POA: Diagnosis not present

## 2020-11-22 DIAGNOSIS — Z1152 Encounter for screening for COVID-19: Secondary | ICD-10-CM | POA: Diagnosis not present

## 2020-11-22 DIAGNOSIS — S31809A Unspecified open wound of unspecified buttock, initial encounter: Secondary | ICD-10-CM | POA: Diagnosis not present

## 2020-12-14 DIAGNOSIS — E119 Type 2 diabetes mellitus without complications: Secondary | ICD-10-CM | POA: Diagnosis not present

## 2020-12-14 DIAGNOSIS — K5900 Constipation, unspecified: Secondary | ICD-10-CM | POA: Diagnosis not present

## 2020-12-20 DIAGNOSIS — E1151 Type 2 diabetes mellitus with diabetic peripheral angiopathy without gangrene: Secondary | ICD-10-CM | POA: Diagnosis not present

## 2020-12-20 DIAGNOSIS — B351 Tinea unguium: Secondary | ICD-10-CM | POA: Diagnosis not present

## 2020-12-20 DIAGNOSIS — R262 Difficulty in walking, not elsewhere classified: Secondary | ICD-10-CM | POA: Diagnosis not present

## 2020-12-20 DIAGNOSIS — Z7984 Long term (current) use of oral hypoglycemic drugs: Secondary | ICD-10-CM | POA: Diagnosis not present

## 2020-12-30 DIAGNOSIS — H1013 Acute atopic conjunctivitis, bilateral: Secondary | ICD-10-CM | POA: Diagnosis not present

## 2021-01-06 DIAGNOSIS — R52 Pain, unspecified: Secondary | ICD-10-CM | POA: Diagnosis not present

## 2021-01-06 DIAGNOSIS — M79602 Pain in left arm: Secondary | ICD-10-CM | POA: Diagnosis not present

## 2021-01-10 DIAGNOSIS — C775 Secondary and unspecified malignant neoplasm of intrapelvic lymph nodes: Secondary | ICD-10-CM | POA: Diagnosis not present

## 2021-01-10 DIAGNOSIS — C61 Malignant neoplasm of prostate: Secondary | ICD-10-CM | POA: Diagnosis not present

## 2021-01-10 DIAGNOSIS — C7951 Secondary malignant neoplasm of bone: Secondary | ICD-10-CM | POA: Diagnosis not present

## 2021-01-11 DIAGNOSIS — H401133 Primary open-angle glaucoma, bilateral, severe stage: Secondary | ICD-10-CM | POA: Diagnosis not present

## 2021-01-20 DIAGNOSIS — E08319 Diabetes mellitus due to underlying condition with unspecified diabetic retinopathy without macular edema: Secondary | ICD-10-CM | POA: Diagnosis not present

## 2021-01-20 DIAGNOSIS — R55 Syncope and collapse: Secondary | ICD-10-CM | POA: Diagnosis not present

## 2021-01-20 DIAGNOSIS — Z8616 Personal history of COVID-19: Secondary | ICD-10-CM | POA: Diagnosis not present

## 2021-01-20 DIAGNOSIS — R9431 Abnormal electrocardiogram [ECG] [EKG]: Secondary | ICD-10-CM | POA: Diagnosis not present

## 2021-01-20 DIAGNOSIS — G9001 Carotid sinus syncope: Secondary | ICD-10-CM | POA: Diagnosis not present

## 2021-01-20 DIAGNOSIS — D649 Anemia, unspecified: Secondary | ICD-10-CM | POA: Diagnosis not present

## 2021-01-20 DIAGNOSIS — R059 Cough, unspecified: Secondary | ICD-10-CM | POA: Diagnosis not present

## 2021-01-20 DIAGNOSIS — Z79899 Other long term (current) drug therapy: Secondary | ICD-10-CM | POA: Diagnosis not present

## 2021-01-20 DIAGNOSIS — R0602 Shortness of breath: Secondary | ICD-10-CM | POA: Diagnosis not present

## 2021-01-23 DIAGNOSIS — Z8616 Personal history of COVID-19: Secondary | ICD-10-CM | POA: Diagnosis not present

## 2021-01-23 DIAGNOSIS — R0602 Shortness of breath: Secondary | ICD-10-CM | POA: Diagnosis not present

## 2021-01-23 DIAGNOSIS — J189 Pneumonia, unspecified organism: Secondary | ICD-10-CM | POA: Diagnosis not present

## 2021-01-23 DIAGNOSIS — R059 Cough, unspecified: Secondary | ICD-10-CM | POA: Diagnosis not present

## 2021-04-04 DIAGNOSIS — E1140 Type 2 diabetes mellitus with diabetic neuropathy, unspecified: Secondary | ICD-10-CM | POA: Diagnosis not present

## 2021-04-04 DIAGNOSIS — B351 Tinea unguium: Secondary | ICD-10-CM | POA: Diagnosis not present

## 2021-04-19 DIAGNOSIS — R1314 Dysphagia, pharyngoesophageal phase: Secondary | ICD-10-CM | POA: Diagnosis not present

## 2021-04-19 DIAGNOSIS — R29898 Other symptoms and signs involving the musculoskeletal system: Secondary | ICD-10-CM | POA: Diagnosis not present

## 2021-04-25 DIAGNOSIS — H409 Unspecified glaucoma: Secondary | ICD-10-CM | POA: Diagnosis not present

## 2021-04-25 DIAGNOSIS — E1122 Type 2 diabetes mellitus with diabetic chronic kidney disease: Secondary | ICD-10-CM | POA: Diagnosis not present

## 2021-04-25 DIAGNOSIS — I10 Essential (primary) hypertension: Secondary | ICD-10-CM | POA: Diagnosis not present

## 2021-04-25 DIAGNOSIS — K219 Gastro-esophageal reflux disease without esophagitis: Secondary | ICD-10-CM | POA: Diagnosis not present

## 2021-04-25 DIAGNOSIS — Z6828 Body mass index (BMI) 28.0-28.9, adult: Secondary | ICD-10-CM | POA: Diagnosis not present

## 2021-04-25 DIAGNOSIS — H1013 Acute atopic conjunctivitis, bilateral: Secondary | ICD-10-CM | POA: Diagnosis not present

## 2021-04-26 DIAGNOSIS — Z79899 Other long term (current) drug therapy: Secondary | ICD-10-CM | POA: Diagnosis not present

## 2021-04-26 DIAGNOSIS — E08319 Diabetes mellitus due to underlying condition with unspecified diabetic retinopathy without macular edema: Secondary | ICD-10-CM | POA: Diagnosis not present

## 2021-04-26 DIAGNOSIS — I10 Essential (primary) hypertension: Secondary | ICD-10-CM | POA: Diagnosis not present

## 2021-04-30 IMAGING — CT CT CERVICAL SPINE W/O CM
4 of 6 series · 15 of 33 positions shown, 17 images · non-contrast
Comparison: None.

CLINICAL DATA: Status post fall

EXAM:
CT HEAD WITHOUT CONTRAST
CT CERVICAL SPINE WITHOUT CONTRAST
TECHNIQUE: Multidetector CT imaging of the head and cervical spine was
performed following the standard protocol without intravenous
contrast. Multiplanar CT image reconstructions of the cervical spine
were also generated.

[Series 5: head bone · axial · 0.39mm/px · z∈[+930,+968]mm · 2 of 76 slices shown]
[im 19/76  bone]
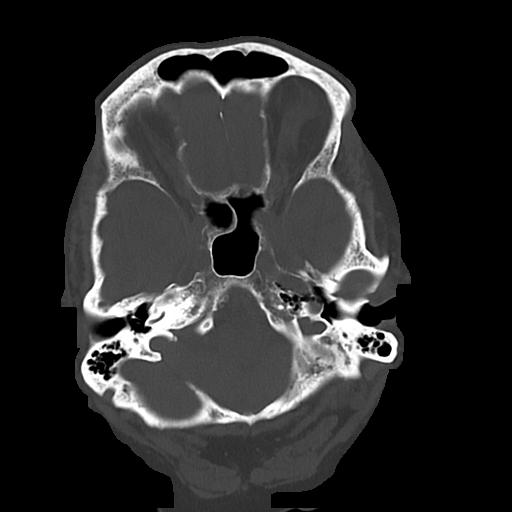
[im 38/76  bone]
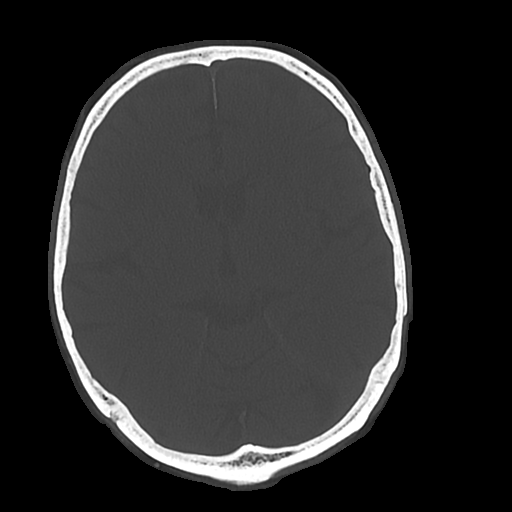

[Series 6: cor soft · coronal · 0.32mm/px · 3 of 65 slices shown]
[im 19/65  bone]
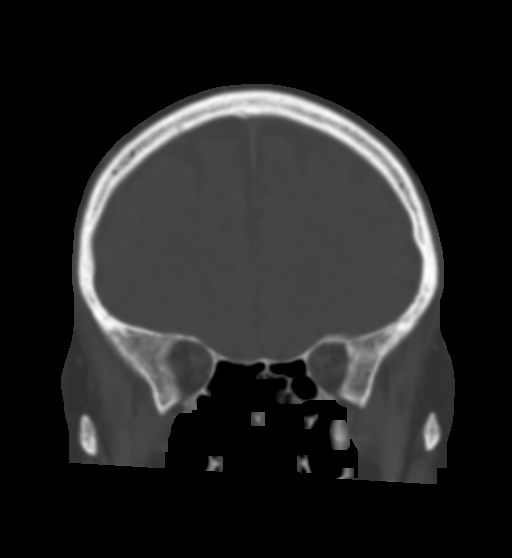
[im 28/65  bone]
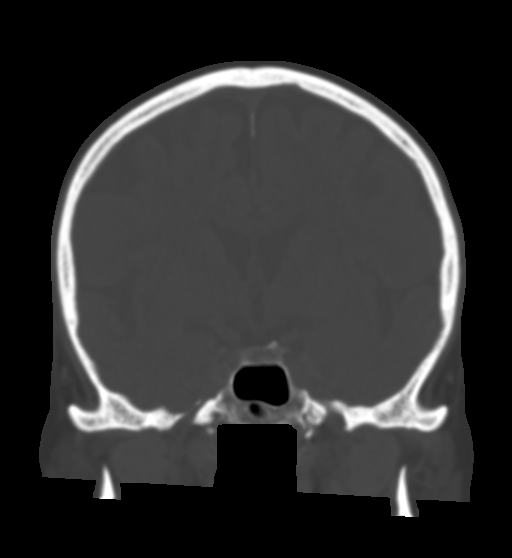
[im 37/65  bone]
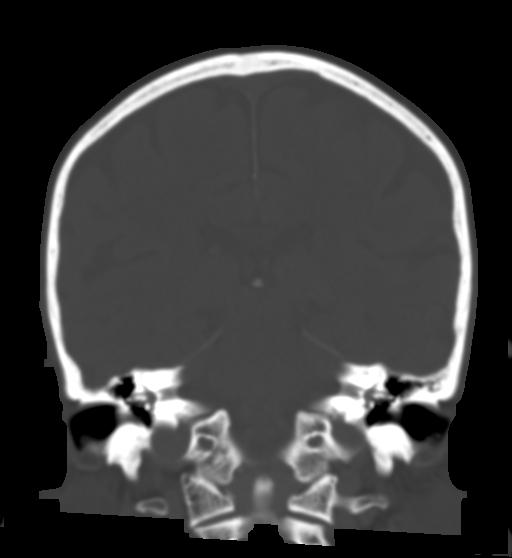

[Series 9: c spine soft · axial · 0.35mm/px · z∈[+814,+942]mm · 5 of 96 slices shown, 7 images]
[im 16/96  soft-tissue]
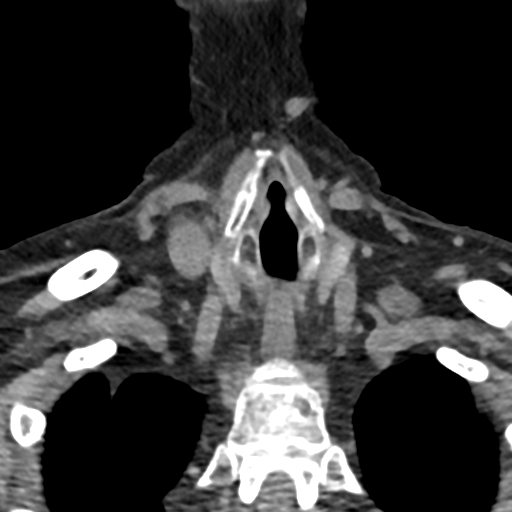
[im 16/96  bone]
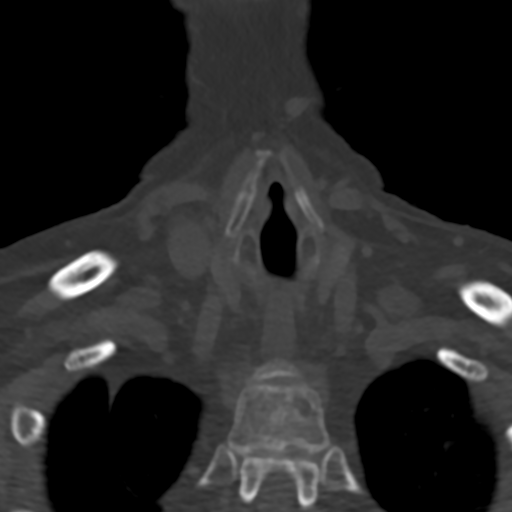
[im 32/96  bone]
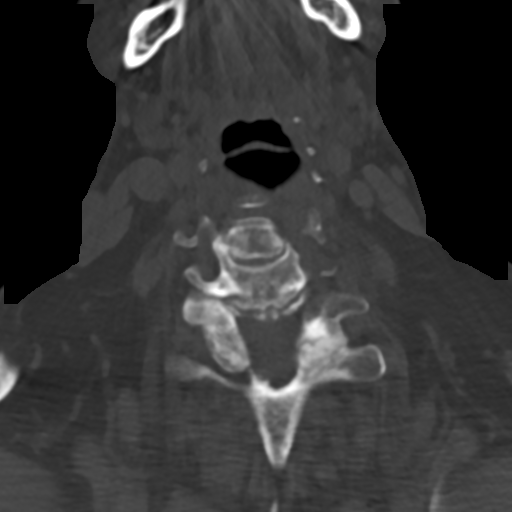
[im 48/96  bone]
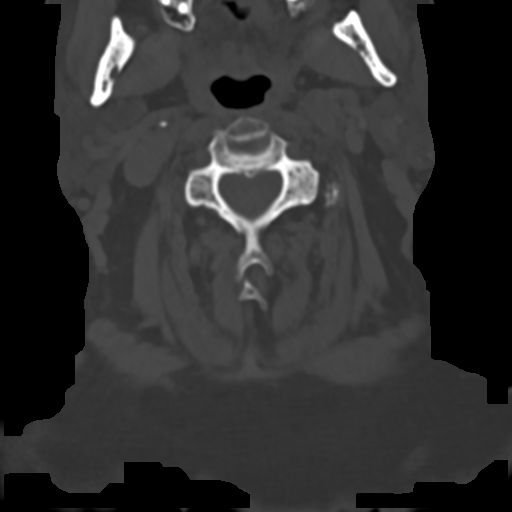
[im 64/96  bone]
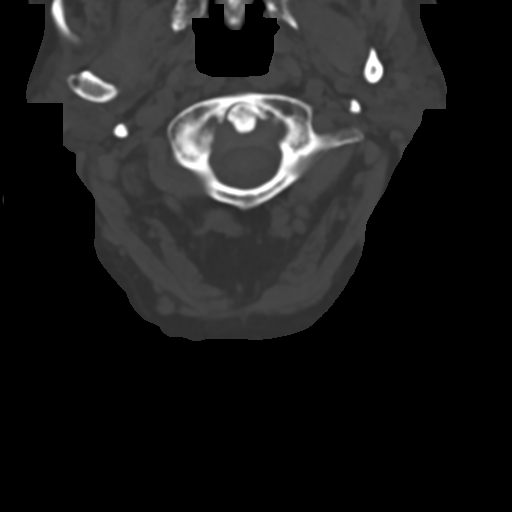
[im 80/96  soft-tissue]
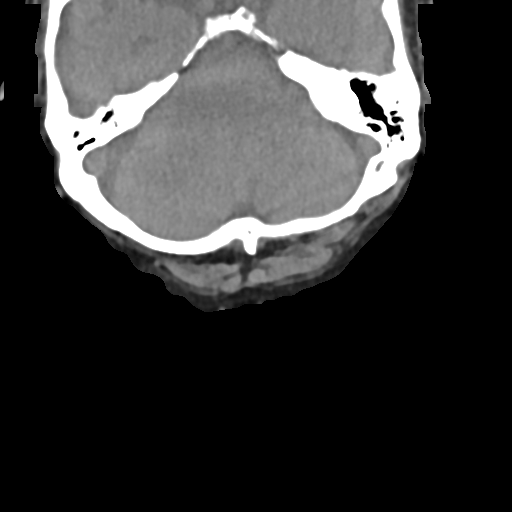
[im 80/96  bone]
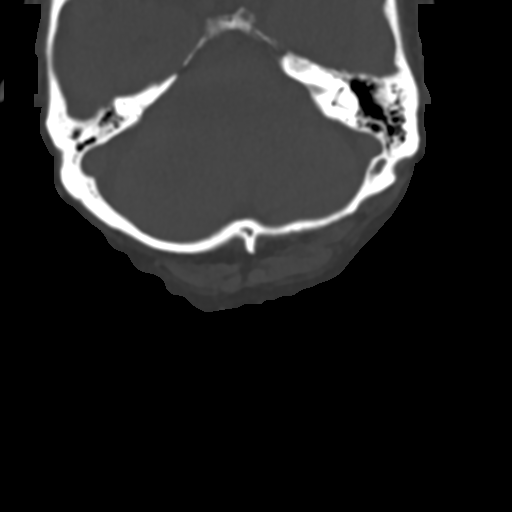

[Series 12: sag bone · sagittal · 0.33mm/px · 5 of 113 slices shown]
[im 29/113  bone]
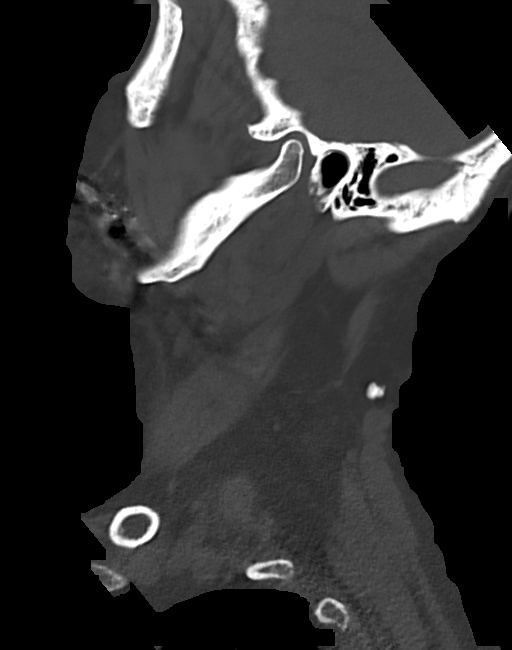
[im 43/113  bone]
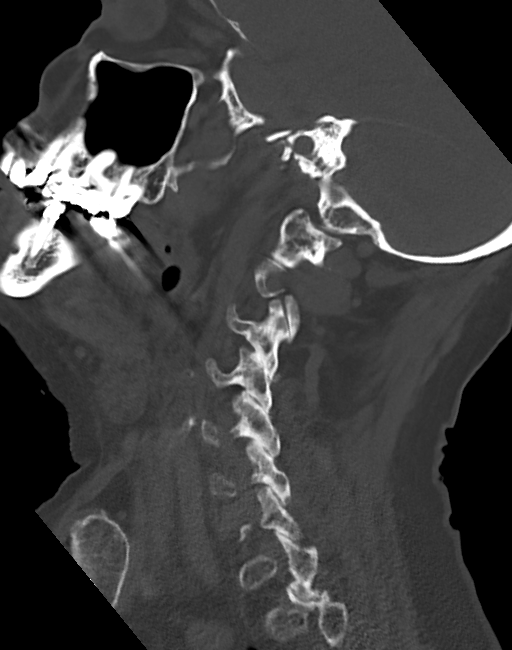
[im 57/113  bone]
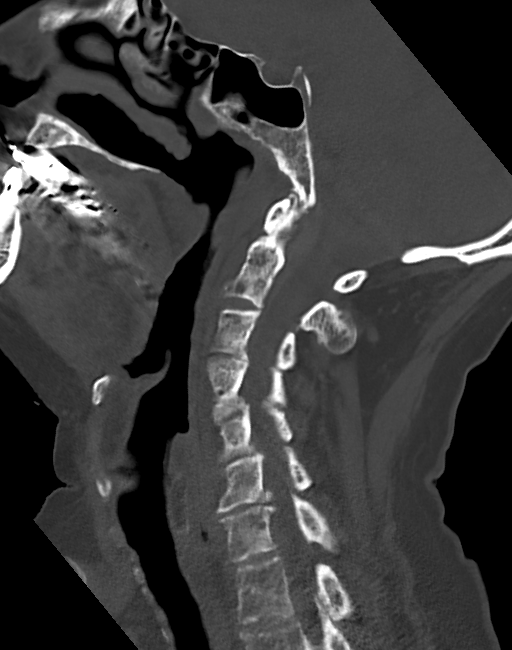
[im 71/113  bone]
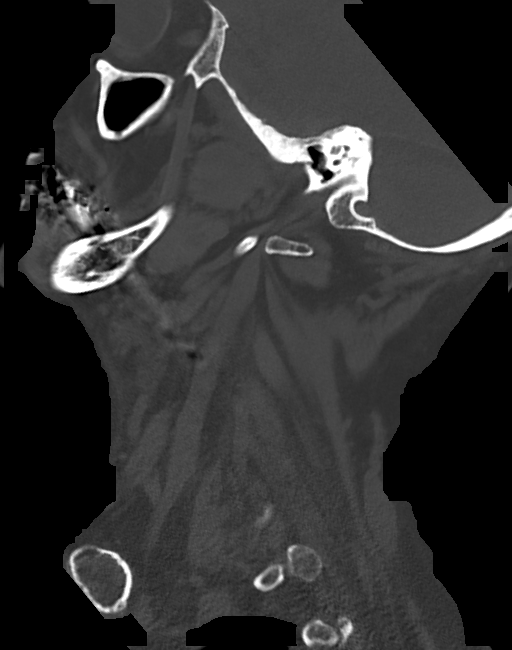
[im 85/113  bone]
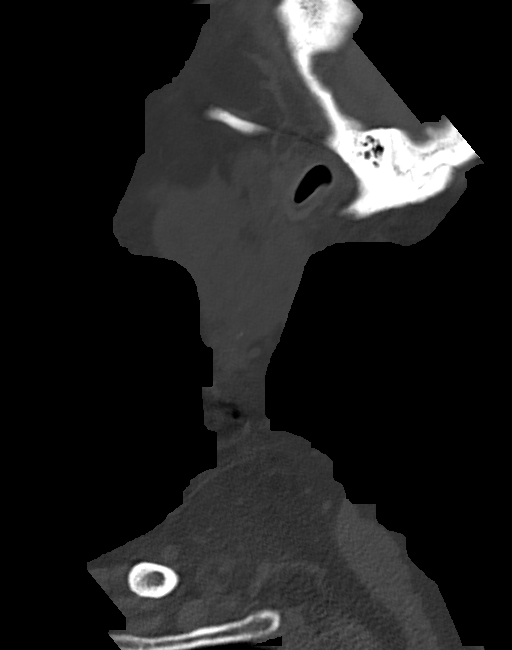

[15 of 33 positions shown; findings below may reference images not displayed]

FINDINGS: CT HEAD FINDINGS

Brain: No evidence of acute infarction, hemorrhage, hydrocephalus,
extra-axial collection or mass lesion/mass effect. There is mild
diffuse low-attenuation within the subcortical and periventricular
white matter compatible with chronic microvascular disease. Remote
right basal ganglia lacunar infarct noted, image [DATE]. Prominence of
the sulci and ventricles are identified consistent with brain
atrophy.

Vascular: No hyperdense vessel or unexpected calcification.

Skull: Normal. Negative for fracture or focal lesion.

Sinuses/Orbits: Paranasal sinuses are clear. Mastoid air cells
clear.

Other: None

CT CERVICAL SPINE FINDINGS

Alignment: Normal.

Skull base and vertebrae: The vertebral body heights are well
preserved.

Soft tissues and spinal canal: No prevertebral fluid or swelling. No
visible canal hematoma.

Disc levels: There is multi level disc space narrowing and ventral
endplate spurring. Most advanced at C5-6 and C6-7. Bilateral facet
hypertrophy and degenerative change.

Upper chest: Negative.

Other: None
IMPRESSION: 1. No acute intracranial abnormalities.
2. Chronic small vessel ischemic change and brain atrophy.
3. No evidence for cervical spine fracture.
4. Cervical spondylosis.

## 2021-05-23 DIAGNOSIS — L299 Pruritus, unspecified: Secondary | ICD-10-CM | POA: Diagnosis not present

## 2021-05-23 DIAGNOSIS — L539 Erythematous condition, unspecified: Secondary | ICD-10-CM | POA: Diagnosis not present

## 2021-05-23 DIAGNOSIS — R238 Other skin changes: Secondary | ICD-10-CM | POA: Diagnosis not present

## 2021-06-05 DIAGNOSIS — R051 Acute cough: Secondary | ICD-10-CM | POA: Diagnosis not present

## 2021-06-05 DIAGNOSIS — R0989 Other specified symptoms and signs involving the circulatory and respiratory systems: Secondary | ICD-10-CM | POA: Diagnosis not present

## 2021-06-05 DIAGNOSIS — R5383 Other fatigue: Secondary | ICD-10-CM | POA: Diagnosis not present

## 2021-06-06 DIAGNOSIS — E119 Type 2 diabetes mellitus without complications: Secondary | ICD-10-CM | POA: Diagnosis not present

## 2021-06-06 DIAGNOSIS — E08319 Diabetes mellitus due to underlying condition with unspecified diabetic retinopathy without macular edema: Secondary | ICD-10-CM | POA: Diagnosis not present

## 2021-06-06 DIAGNOSIS — R5383 Other fatigue: Secondary | ICD-10-CM | POA: Diagnosis not present

## 2021-06-06 DIAGNOSIS — R0989 Other specified symptoms and signs involving the circulatory and respiratory systems: Secondary | ICD-10-CM | POA: Diagnosis not present

## 2021-06-06 DIAGNOSIS — I10 Essential (primary) hypertension: Secondary | ICD-10-CM | POA: Diagnosis not present

## 2021-06-06 DIAGNOSIS — R051 Acute cough: Secondary | ICD-10-CM | POA: Diagnosis not present

## 2021-06-06 DIAGNOSIS — Z79899 Other long term (current) drug therapy: Secondary | ICD-10-CM | POA: Diagnosis not present

## 2021-06-06 DIAGNOSIS — I509 Heart failure, unspecified: Secondary | ICD-10-CM | POA: Diagnosis not present

## 2021-06-15 DIAGNOSIS — L570 Actinic keratosis: Secondary | ICD-10-CM | POA: Diagnosis not present

## 2021-06-15 DIAGNOSIS — X32XXXA Exposure to sunlight, initial encounter: Secondary | ICD-10-CM | POA: Diagnosis not present

## 2021-06-27 DIAGNOSIS — R0602 Shortness of breath: Secondary | ICD-10-CM | POA: Diagnosis not present

## 2021-06-27 DIAGNOSIS — I509 Heart failure, unspecified: Secondary | ICD-10-CM | POA: Diagnosis not present

## 2021-06-27 DIAGNOSIS — I10 Essential (primary) hypertension: Secondary | ICD-10-CM | POA: Diagnosis not present

## 2021-06-27 DIAGNOSIS — K219 Gastro-esophageal reflux disease without esophagitis: Secondary | ICD-10-CM | POA: Diagnosis not present

## 2021-06-27 DIAGNOSIS — R079 Chest pain, unspecified: Secondary | ICD-10-CM | POA: Diagnosis not present

## 2021-06-28 DIAGNOSIS — I10 Essential (primary) hypertension: Secondary | ICD-10-CM | POA: Diagnosis not present

## 2021-06-28 DIAGNOSIS — I509 Heart failure, unspecified: Secondary | ICD-10-CM | POA: Diagnosis not present

## 2021-06-28 DIAGNOSIS — R0602 Shortness of breath: Secondary | ICD-10-CM | POA: Diagnosis not present

## 2021-06-28 DIAGNOSIS — R079 Chest pain, unspecified: Secondary | ICD-10-CM | POA: Diagnosis not present

## 2021-06-29 DIAGNOSIS — D649 Anemia, unspecified: Secondary | ICD-10-CM | POA: Diagnosis not present

## 2021-06-29 DIAGNOSIS — I509 Heart failure, unspecified: Secondary | ICD-10-CM | POA: Diagnosis not present

## 2021-06-29 DIAGNOSIS — R0602 Shortness of breath: Secondary | ICD-10-CM | POA: Diagnosis not present

## 2021-06-29 DIAGNOSIS — Z79899 Other long term (current) drug therapy: Secondary | ICD-10-CM | POA: Diagnosis not present

## 2021-06-29 DIAGNOSIS — M25561 Pain in right knee: Secondary | ICD-10-CM | POA: Diagnosis not present

## 2021-06-30 ENCOUNTER — Encounter (HOSPITAL_COMMUNITY): Payer: Self-pay | Admitting: Internal Medicine

## 2021-06-30 ENCOUNTER — Inpatient Hospital Stay (HOSPITAL_COMMUNITY)
Admission: EM | Admit: 2021-06-30 | Discharge: 2021-07-04 | DRG: 872 | Disposition: A | Discharge status: Skilled Nursing Facility | Payer: Medicare HMO | Source: Skilled Nursing Facility | Attending: Internal Medicine | Admitting: Internal Medicine

## 2021-06-30 ENCOUNTER — Emergency Department (HOSPITAL_COMMUNITY): Payer: Medicare HMO

## 2021-06-30 ENCOUNTER — Other Ambulatory Visit: Payer: Self-pay

## 2021-06-30 DIAGNOSIS — Z8546 Personal history of malignant neoplasm of prostate: Secondary | ICD-10-CM | POA: Diagnosis not present

## 2021-06-30 DIAGNOSIS — R404 Transient alteration of awareness: Secondary | ICD-10-CM | POA: Diagnosis not present

## 2021-06-30 DIAGNOSIS — E119 Type 2 diabetes mellitus without complications: Secondary | ICD-10-CM | POA: Diagnosis not present

## 2021-06-30 DIAGNOSIS — R140 Abdominal distension (gaseous): Secondary | ICD-10-CM | POA: Diagnosis present

## 2021-06-30 DIAGNOSIS — N390 Urinary tract infection, site not specified: Secondary | ICD-10-CM | POA: Diagnosis present

## 2021-06-30 DIAGNOSIS — Z86718 Personal history of other venous thrombosis and embolism: Secondary | ICD-10-CM | POA: Diagnosis not present

## 2021-06-30 DIAGNOSIS — R5381 Other malaise: Secondary | ICD-10-CM | POA: Diagnosis present

## 2021-06-30 DIAGNOSIS — J90 Pleural effusion, not elsewhere classified: Secondary | ICD-10-CM | POA: Diagnosis not present

## 2021-06-30 DIAGNOSIS — I700 Atherosclerosis of aorta: Secondary | ICD-10-CM | POA: Diagnosis not present

## 2021-06-30 DIAGNOSIS — R52 Pain, unspecified: Secondary | ICD-10-CM | POA: Diagnosis not present

## 2021-06-30 DIAGNOSIS — I480 Paroxysmal atrial fibrillation: Secondary | ICD-10-CM | POA: Diagnosis present

## 2021-06-30 DIAGNOSIS — Z7984 Long term (current) use of oral hypoglycemic drugs: Secondary | ICD-10-CM

## 2021-06-30 DIAGNOSIS — Z86711 Personal history of pulmonary embolism: Secondary | ICD-10-CM | POA: Diagnosis not present

## 2021-06-30 DIAGNOSIS — R0902 Hypoxemia: Secondary | ICD-10-CM | POA: Diagnosis not present

## 2021-06-30 DIAGNOSIS — R918 Other nonspecific abnormal finding of lung field: Secondary | ICD-10-CM | POA: Diagnosis not present

## 2021-06-30 DIAGNOSIS — Z7901 Long term (current) use of anticoagulants: Secondary | ICD-10-CM | POA: Diagnosis not present

## 2021-06-30 DIAGNOSIS — Z8673 Personal history of transient ischemic attack (TIA), and cerebral infarction without residual deficits: Secondary | ICD-10-CM

## 2021-06-30 DIAGNOSIS — Z800 Family history of malignant neoplasm of digestive organs: Secondary | ICD-10-CM

## 2021-06-30 DIAGNOSIS — Z66 Do not resuscitate: Secondary | ICD-10-CM | POA: Diagnosis present

## 2021-06-30 DIAGNOSIS — L89322 Pressure ulcer of left buttock, stage 2: Secondary | ICD-10-CM | POA: Diagnosis present

## 2021-06-30 DIAGNOSIS — T17908A Unspecified foreign body in respiratory tract, part unspecified causing other injury, initial encounter: Secondary | ICD-10-CM

## 2021-06-30 DIAGNOSIS — A419 Sepsis, unspecified organism: Secondary | ICD-10-CM | POA: Diagnosis not present

## 2021-06-30 DIAGNOSIS — Z91041 Radiographic dye allergy status: Secondary | ICD-10-CM | POA: Diagnosis not present

## 2021-06-30 DIAGNOSIS — I5189 Other ill-defined heart diseases: Secondary | ICD-10-CM

## 2021-06-30 DIAGNOSIS — Z20822 Contact with and (suspected) exposure to covid-19: Secondary | ICD-10-CM | POA: Diagnosis not present

## 2021-06-30 DIAGNOSIS — D649 Anemia, unspecified: Secondary | ICD-10-CM | POA: Diagnosis present

## 2021-06-30 DIAGNOSIS — J9811 Atelectasis: Secondary | ICD-10-CM | POA: Diagnosis not present

## 2021-06-30 DIAGNOSIS — Z823 Family history of stroke: Secondary | ICD-10-CM | POA: Diagnosis not present

## 2021-06-30 DIAGNOSIS — Z79899 Other long term (current) drug therapy: Secondary | ICD-10-CM

## 2021-06-30 DIAGNOSIS — M25562 Pain in left knee: Secondary | ICD-10-CM | POA: Diagnosis not present

## 2021-06-30 DIAGNOSIS — X58XXXA Exposure to other specified factors, initial encounter: Secondary | ICD-10-CM | POA: Diagnosis present

## 2021-06-30 DIAGNOSIS — I639 Cerebral infarction, unspecified: Secondary | ICD-10-CM | POA: Insufficient documentation

## 2021-06-30 DIAGNOSIS — Z91018 Allergy to other foods: Secondary | ICD-10-CM

## 2021-06-30 DIAGNOSIS — I4891 Unspecified atrial fibrillation: Secondary | ICD-10-CM | POA: Diagnosis present

## 2021-06-30 DIAGNOSIS — L89152 Pressure ulcer of sacral region, stage 2: Secondary | ICD-10-CM | POA: Diagnosis present

## 2021-06-30 DIAGNOSIS — F32A Depression, unspecified: Secondary | ICD-10-CM | POA: Diagnosis present

## 2021-06-30 DIAGNOSIS — Z91013 Allergy to seafood: Secondary | ICD-10-CM | POA: Diagnosis not present

## 2021-06-30 DIAGNOSIS — S82002A Unspecified fracture of left patella, initial encounter for closed fracture: Secondary | ICD-10-CM | POA: Diagnosis not present

## 2021-06-30 DIAGNOSIS — L8990 Pressure ulcer of unspecified site, unspecified stage: Secondary | ICD-10-CM | POA: Diagnosis present

## 2021-06-30 DIAGNOSIS — I10 Essential (primary) hypertension: Secondary | ICD-10-CM | POA: Diagnosis not present

## 2021-06-30 DIAGNOSIS — Z743 Need for continuous supervision: Secondary | ICD-10-CM | POA: Diagnosis not present

## 2021-06-30 DIAGNOSIS — M7989 Other specified soft tissue disorders: Secondary | ICD-10-CM | POA: Diagnosis not present

## 2021-06-30 HISTORY — DX: Cerebral infarction, unspecified: I63.9

## 2021-06-30 LAB — COMPREHENSIVE METABOLIC PANEL
ALT: 15 U/L (ref 0–44)
AST: 25 U/L (ref 15–41)
Albumin: 3.5 g/dL (ref 3.5–5.0)
Alkaline Phosphatase: 81 U/L (ref 38–126)
Anion gap: 10 (ref 5–15)
BUN: 20 mg/dL (ref 8–23)
CO2: 26 mmol/L (ref 22–32)
Calcium: 8.7 mg/dL — ABNORMAL LOW (ref 8.9–10.3)
Chloride: 99 mmol/L (ref 98–111)
Creatinine, Ser: 0.72 mg/dL (ref 0.61–1.24)
GFR, Estimated: >60 mL/min (ref >60)
Glucose, Bld: 216 mg/dL — ABNORMAL HIGH (ref 70–99)
Potassium: 3.8 mmol/L (ref 3.5–5.1)
Sodium: 135 mmol/L (ref 135–145)
Total Bilirubin: 1 mg/dL (ref 0.3–1.2)
Total Protein: 7.1 g/dL (ref 6.5–8.1)

## 2021-06-30 LAB — PROTIME-INR
INR: 1.6 — ABNORMAL HIGH (ref 0.8–1.2)
Prothrombin Time: 18.6 seconds — ABNORMAL HIGH (ref 11.4–15.2)

## 2021-06-30 LAB — CBC WITH DIFFERENTIAL/PLATELET
Abs Immature Granulocytes: 0.08 10*3/uL — ABNORMAL HIGH (ref 0.00–0.07)
Basophils Absolute: 0 10*3/uL (ref 0.0–0.1)
Basophils Relative: 0 %
Eosinophils Absolute: 0.1 10*3/uL (ref 0.0–0.5)
Eosinophils Relative: 1 %
HCT: 36.7 % — ABNORMAL LOW (ref 39.0–52.0)
Hemoglobin: 11.3 g/dL — ABNORMAL LOW (ref 13.0–17.0)
Immature Granulocytes: 1 %
Lymphocytes Relative: 6 %
Lymphs Abs: 0.7 10*3/uL (ref 0.7–4.0)
MCH: 28.6 pg (ref 26.0–34.0)
MCHC: 30.8 g/dL (ref 30.0–36.0)
MCV: 92.9 fL (ref 80.0–100.0)
Monocytes Absolute: 0.9 10*3/uL (ref 0.1–1.0)
Monocytes Relative: 8 %
Neutro Abs: 10.3 10*3/uL — ABNORMAL HIGH (ref 1.7–7.7)
Neutrophils Relative %: 84 %
Platelets: 236 10*3/uL (ref 150–400)
RBC: 3.95 MIL/uL — ABNORMAL LOW (ref 4.22–5.81)
RDW: 15.3 % (ref 11.5–15.5)
WBC: 12.2 10*3/uL — ABNORMAL HIGH (ref 4.0–10.5)
nRBC: 0 % (ref 0.0–0.2)

## 2021-06-30 LAB — URINALYSIS, ROUTINE W REFLEX MICROSCOPIC
Bacteria, UA: NONE SEEN
Bilirubin Urine: NEGATIVE
Glucose, UA: NEGATIVE mg/dL
Hgb urine dipstick: NEGATIVE
Ketones, ur: NEGATIVE mg/dL
Nitrite: NEGATIVE
Protein, ur: NEGATIVE mg/dL
Specific Gravity, Urine: 1.019 (ref 1.005–1.030)
pH: 6 (ref 5.0–8.0)

## 2021-06-30 LAB — LACTIC ACID, PLASMA
Lactic Acid, Venous: 2.8 mmol/L (ref 0.5–1.9)
Lactic Acid, Venous: 2.8 mmol/L (ref 0.5–1.9)
Lactic Acid, Venous: 1.6 mmol/L (ref 0.5–1.9)

## 2021-06-30 LAB — RESP PANEL BY RT-PCR (FLU A&B, COVID) ARPGX2
Influenza A by PCR: NEGATIVE
Influenza B by PCR: NEGATIVE
SARS Coronavirus 2 by RT PCR: NEGATIVE

## 2021-06-30 LAB — C-REACTIVE PROTEIN: CRP: 9.3 mg/dL — ABNORMAL HIGH (ref <1.0)

## 2021-06-30 LAB — APTT: aPTT: 39 seconds — ABNORMAL HIGH (ref 24–36)

## 2021-06-30 LAB — SEDIMENTATION RATE: Sed Rate: 56 mm/hr — ABNORMAL HIGH (ref 0–16)

## 2021-06-30 LAB — GLUCOSE, CAPILLARY: Glucose-Capillary: 117 mg/dL — ABNORMAL HIGH (ref 70–99)

## 2021-06-30 MED ORDER — METRONIDAZOLE 500 MG/100ML IV SOLN
500.0000 mg | Freq: Two times a day (BID) | INTRAVENOUS | Status: DC
  Administered 2021-07-01 – 2021-07-03 (×5): 500 mg via INTRAVENOUS
  Filled 2021-06-30 (×5): qty 100

## 2021-06-30 MED ORDER — OXYCODONE HCL 5 MG PO TABS
5.0000 mg | ORAL_TABLET | ORAL | Status: DC | PRN
  Administered 2021-07-03 (×4): 5 mg via ORAL
  Filled 2021-06-30 (×4): qty 1

## 2021-06-30 MED ORDER — FINASTERIDE 5 MG PO TABS
5.0000 mg | ORAL_TABLET | Freq: Every day | ORAL | Status: DC
  Administered 2021-06-30 – 2021-07-03 (×4): 5 mg via ORAL
  Filled 2021-06-30 (×4): qty 1

## 2021-06-30 MED ORDER — FUROSEMIDE 40 MG PO TABS
40.0000 mg | ORAL_TABLET | Freq: Every day | ORAL | Status: DC
  Administered 2021-07-01 – 2021-07-03 (×3): 40 mg via ORAL
  Filled 2021-06-30 (×4): qty 1

## 2021-06-30 MED ORDER — ACETAMINOPHEN 325 MG PO TABS
650.0000 mg | ORAL_TABLET | Freq: Four times a day (QID) | ORAL | Status: DC | PRN
  Administered 2021-06-30 – 2021-07-03 (×4): 650 mg via ORAL
  Filled 2021-06-30 (×4): qty 2

## 2021-06-30 MED ORDER — POLYETHYLENE GLYCOL 3350 17 G PO PACK
17.0000 g | PACK | Freq: Every day | ORAL | Status: DC
  Administered 2021-07-01 – 2021-07-03 (×3): 17 g via ORAL
  Filled 2021-06-30 (×3): qty 1

## 2021-06-30 MED ORDER — ACETAMINOPHEN 650 MG RE SUPP: 650.0000 mg | Freq: Four times a day (QID) | RECTAL | Status: DC | PRN

## 2021-06-30 MED ORDER — METFORMIN HCL 500 MG PO TABS
500.0000 mg | ORAL_TABLET | Freq: Two times a day (BID) | ORAL | Status: DC
  Administered 2021-07-01: 500 mg via ORAL
  Filled 2021-06-30: qty 1

## 2021-06-30 MED ORDER — POLYVINYL ALCOHOL 1.4 % OP SOLN
1.0000 [drp] | Freq: Three times a day (TID) | OPHTHALMIC | Status: DC
  Administered 2021-06-30 – 2021-07-03 (×9): 1 [drp] via OPHTHALMIC
  Filled 2021-06-30: qty 15

## 2021-06-30 MED ORDER — ALUM & MAG HYDROXIDE-SIMETH 200-200-20 MG/5ML PO SUSP
30.0000 mL | ORAL | Status: DC | PRN
  Administered 2021-06-30: 30 mL via ORAL
  Filled 2021-06-30: qty 30

## 2021-06-30 MED ORDER — SODIUM CHLORIDE 0.9 % IV SOLN
2.0000 g | Freq: Once | INTRAVENOUS | Status: AC
  Administered 2021-06-30: 2 g via INTRAVENOUS
  Filled 2021-06-30: qty 2

## 2021-06-30 MED ORDER — METRONIDAZOLE 500 MG/100ML IV SOLN
500.0000 mg | Freq: Once | INTRAVENOUS | Status: AC
  Administered 2021-06-30: 500 mg via INTRAVENOUS
  Filled 2021-06-30: qty 100

## 2021-06-30 MED ORDER — LATANOPROST 0.005 % OP SOLN
1.0000 [drp] | Freq: Every day | OPHTHALMIC | Status: DC
  Administered 2021-06-30 – 2021-07-03 (×4): 1 [drp] via OPHTHALMIC
  Filled 2021-06-30: qty 2.5

## 2021-06-30 MED ORDER — LORATADINE 10 MG PO TABS
10.0000 mg | ORAL_TABLET | Freq: Every day | ORAL | Status: DC
  Administered 2021-07-01 – 2021-07-03 (×3): 10 mg via ORAL
  Filled 2021-06-30 (×3): qty 1

## 2021-06-30 MED ORDER — VITAMIN D 25 MCG (1000 UNIT) PO TABS
50.0000 ug | ORAL_TABLET | Freq: Every day | ORAL | Status: DC
  Administered 2021-07-01 – 2021-07-03 (×3): 50 ug via ORAL
  Filled 2021-06-30 (×2): qty 1
  Filled 2021-06-30: qty 2

## 2021-06-30 MED ORDER — VANCOMYCIN HCL 1500 MG/300ML IV SOLN
1500.0000 mg | INTRAVENOUS | Status: AC
  Administered 2021-06-30: 1500 mg via INTRAVENOUS
  Filled 2021-06-30: qty 300

## 2021-06-30 MED ORDER — INSULIN ASPART 100 UNIT/ML IJ SOLN
0.0000 [IU] | Freq: Three times a day (TID) | INTRAMUSCULAR | Status: DC
  Administered 2021-07-01: 2 [IU] via SUBCUTANEOUS
  Administered 2021-07-02: 1 [IU] via SUBCUTANEOUS
  Administered 2021-07-02 – 2021-07-03 (×3): 2 [IU] via SUBCUTANEOUS
  Filled 2021-06-30: qty 0.09

## 2021-06-30 MED ORDER — GLIPIZIDE 5 MG PO TABS
5.0000 mg | ORAL_TABLET | Freq: Every day | ORAL | Status: DC
  Administered 2021-07-01: 5 mg via ORAL
  Filled 2021-06-30: qty 1

## 2021-06-30 MED ORDER — VANCOMYCIN HCL IN DEXTROSE 1-5 GM/200ML-% IV SOLN: 1000.0000 mg | Freq: Once | INTRAVENOUS | Status: DC

## 2021-06-30 MED ORDER — LACTATED RINGERS IV BOLUS
1000.0000 mL | Freq: Once | INTRAVENOUS | Status: AC
  Administered 2021-06-30: 1000 mL via INTRAVENOUS

## 2021-06-30 MED ORDER — FLUTICASONE PROPIONATE 50 MCG/ACT NA SUSP
1.0000 | Freq: Every day | NASAL | Status: DC
  Administered 2021-07-01 – 2021-07-03 (×3): 1 via NASAL
  Filled 2021-06-30: qty 16

## 2021-06-30 MED ORDER — PANTOPRAZOLE SODIUM 40 MG PO TBEC
40.0000 mg | DELAYED_RELEASE_TABLET | Freq: Two times a day (BID) | ORAL | Status: DC
  Administered 2021-06-30 – 2021-07-03 (×7): 40 mg via ORAL
  Filled 2021-06-30 (×7): qty 1

## 2021-06-30 MED ORDER — APIXABAN 5 MG PO TABS
5.0000 mg | ORAL_TABLET | Freq: Two times a day (BID) | ORAL | Status: DC
  Administered 2021-06-30 – 2021-07-03 (×7): 5 mg via ORAL
  Filled 2021-06-30 (×7): qty 1

## 2021-06-30 MED ORDER — KETOROLAC TROMETHAMINE 15 MG/ML IJ SOLN
15.0000 mg | Freq: Once | INTRAMUSCULAR | Status: AC
  Administered 2021-06-30: 15 mg via INTRAVENOUS
  Filled 2021-06-30: qty 1

## 2021-06-30 MED ORDER — DICLOFENAC SODIUM 1 % EX GEL
4.0000 g | Freq: Three times a day (TID) | CUTANEOUS | Status: DC | PRN
  Administered 2021-07-03: 4 g via TOPICAL
  Filled 2021-06-30 (×3): qty 100

## 2021-06-30 MED ORDER — ONDANSETRON HCL 4 MG/2ML IJ SOLN: 4.0000 mg | Freq: Four times a day (QID) | INTRAMUSCULAR | Status: DC | PRN

## 2021-06-30 MED ORDER — LACTATED RINGERS IV SOLN: INTRAVENOUS | Status: DC

## 2021-06-30 MED ORDER — ONDANSETRON HCL 4 MG PO TABS: 4.0000 mg | ORAL_TABLET | Freq: Four times a day (QID) | ORAL | Status: DC | PRN

## 2021-06-30 MED ORDER — LISINOPRIL 10 MG PO TABS
10.0000 mg | ORAL_TABLET | Freq: Every day | ORAL | Status: DC
  Administered 2021-07-01 – 2021-07-03 (×3): 10 mg via ORAL
  Filled 2021-06-30 (×3): qty 1

## 2021-06-30 MED ORDER — MENTHOL 3 MG MT LOZG: 1.0000 | LOZENGE | OROMUCOSAL | Status: DC | PRN

## 2021-06-30 MED ORDER — LACTATED RINGERS IV BOLUS (SEPSIS)
1000.0000 mL | Freq: Once | INTRAVENOUS | Status: AC
  Administered 2021-06-30: 1000 mL via INTRAVENOUS

## 2021-06-30 MED ORDER — VANCOMYCIN HCL IN DEXTROSE 1-5 GM/200ML-% IV SOLN
1000.0000 mg | INTRAVENOUS | Status: DC
  Administered 2021-07-01 – 2021-07-02 (×2): 1000 mg via INTRAVENOUS
  Filled 2021-06-30 (×2): qty 200

## 2021-06-30 MED ORDER — SODIUM CHLORIDE 0.9 % IV SOLN
2.0000 g | Freq: Two times a day (BID) | INTRAVENOUS | Status: DC
  Administered 2021-06-30 – 2021-07-03 (×6): 2 g via INTRAVENOUS
  Filled 2021-06-30 (×6): qty 2

## 2021-06-30 NOTE — ED Triage Notes (Signed)
Pt BIBA from [facility]. Called out for intermittent left knee pain unrelieved by prescribed oxycodone. Left knee hot and erythematous. EMS noted tachypnea, wheezing, firm abdomen. Hx UTI and afib. Eliquis on paperwork.   BP 140/80 manual HR 106-110 RR 20-24 CBG 238 SpO2 95% RA

## 2021-06-30 NOTE — ED Provider Notes (Signed)
[facility] DEPT Provider Note   CSN: [id number] Arrival date & time: 06/30/21  1040     History Chief Complaint  Patient presents with   Knee Pain    Matthew Velez is a 87 y.o. male who presents for evaluation with complaint of severe left knee pain that he has been experiencing over the last year.  Movement and weightbearing are limited by pain.  He denies fever, chills, chest pain.  He does endorse wheezing and shortness of breath that he notes have been present over the last few years though these are not worse than his baseline.    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     Family History  Problem Relation Age of Onset   Colon cancer Mother    Stroke Father     Social History   Tobacco Use   Smoking status: Never   Smokeless tobacco: Never  Vaping Use   Vaping Use: Never used  Substance Use Topics   Alcohol use: Not Currently   Drug use: Never    Home Medications Prior to Admission medications   Medication Sig Start Date End Date Taking? Authorizing Provider  acetaminophen (TYLENOL) 325 MG tablet Take 650 mg by mouth in the morning, at noon, and at bedtime.   Yes [provider]  acetaminophen  (TYLENOL) 650 MG CR tablet Take 650 mg by mouth every 6 (six) hours as needed for pain.   Yes [provider]  alum & mag hydroxide-simeth (MAALOX PLUS) 400-400-40 MG/5ML suspension Take 30 mLs by mouth every 4 (four) hours as needed for indigestion.   Yes [provider]  apixaban (ELIQUIS) 5 MG TABS tablet Take 5 mg by mouth 2 (two) times daily.   Yes [provider]  azelastine (OPTIVAR) 0.05 % ophthalmic solution Place 1 drop into both eyes 2 (two) times daily.   Yes [provider]  carboxymethylcellulose 1 % ophthalmic solution Place 1 drop into both eyes 3 (three) times daily.   Yes [provider]  Cholecalciferol (VITAMIN D3) 50 MCG (2000 UT) TABS Take 50 mcg by mouth daily.   Yes [provider]  Cranberry-Vitamin C-Vitamin E 4200-20-3 MG-MG-UNIT CAPS Take 1 capsule by mouth daily.   Yes [provider]  diclofenac Sodium (VOLTAREN) 1 % GEL Apply 4 g topically 3 (three) times daily as needed (bilateral knee pain).   Yes [provider]  finasteride (PROSCAR) 5 MG tablet Take 5 mg by mouth at bedtime.   Yes [provider]  fluticasone (FLONASE) 50 MCG/ACT nasal spray Place 1 spray into both nostrils daily.   Yes [provider]  furosemide (LASIX) 40 MG tablet Take 1 tablet (40 mg total) by mouth daily. 12/16/18  Yes Eugenie Filler, MD  glipiZIDE (GLUCOTROL) 5 MG tablet Take 5 mg by mouth daily.    Yes [provider]  latanoprost (XALATAN) 0.005 % ophthalmic solution Place 1 drop into both eyes at bedtime.   Yes [provider]  lisinopril (PRINIVIL,ZESTRIL) 10 MG tablet Take 1 tablet (10 mg total) by mouth daily. 12/17/18  Yes Eugenie Filler, MD  loratadine (CLARITIN) 10 MG tablet Take 10 mg by mouth daily.   Yes [provider]  menthol-cetylpyridinium (CEPACOL) 3 MG lozenge Take 1 lozenge by mouth every 2 (two) hours as needed for sore throat.   Yes [provider]  metFORMIN (GLUCOPHAGE) 500 MG tablet Take 500 mg by mouth in the morning and at bedtime.   Yes [provider]  morphine 20 MG/5ML solution Take 5 mg by mouth every 4 (four) hours as needed (chest pain or shortness of breath).   Yes [provider]  omeprazole (PRILOSEC) 20 MG capsule Take 20 mg by mouth 2 (two) times daily.    Yes [provider]  oxyCODONE (OXY IR/ROXICODONE) 5 MG immediate release tablet Take 5 mg by mouth every 4 (four) hours as needed for severe pain.   Yes [provider]  polyethylene glycol (MIRALAX / GLYCOLAX) 17 g packet Take 17 g by mouth daily.   Yes [provider]    Allergies    Chocolate, Fish allergy, Mixed feathers, Other, Shrimp extract allergy skin test, and Iodine  Review of Systems   Review of Systems  Constitutional:  Negative for chills and fever.  Respiratory:  Positive for shortness of breath and wheezing. Negative for cough and chest tightness.   Cardiovascular:  Negative for chest pain.  Gastrointestinal:  Positive for abdominal distention. Negative for constipation and diarrhea.  Genitourinary:  Negative for dysuria.  Musculoskeletal:  Positive for joint swelling.   Physical Exam Updated Vital Signs BP 138/75 (BP Location: Right Arm)   Pulse 95   Temp 98.5 F (36.9 C) (Oral)   Resp 16   Ht _0  (1.651 m)   Wt 77.1 kg   SpO2 100%   BMI 28.29 kg/m   Constitutional: Elderly gentleman with evident pain. HENT: Patient has hearing aids. Cardio: Irregularly irregular rhythm with tachycardia.  No murmurs, rubs, gallops. Pulm: Bilateral expiratory wheezes.  Tachypneic. Abdomen: Nontender but distended. MSK: Left knee is edematous, erythematous particularly over kneecap with increased warmth.  Patient denies tenderness to palpation.  Knee is fixed in a bent position due to pain. Skin: Skin is warm and dry. Neuro: Alert and oriented x3.  No focal deficit noted. Psych: Appropriate  mood and affect.  ED Results / Procedures / Treatments   Labs (all labs ordered are listed, but only abnormal results are displayed) Labs Reviewed  COMPREHENSIVE METABOLIC PANEL - Abnormal; Notable for the following components:      Result Value   Glucose, Bld 216 (*)    Calcium 8.7 (*)    All other components within normal limits  LACTIC ACID, PLASMA - Abnormal; Notable for the following components:   Lactic Acid, Venous 2.8 (*)    All other components within normal limits  CBC WITH DIFFERENTIAL/PLATELET - Abnormal; Notable for the following components:   WBC 12.2 (*)    RBC 3.95 (*)    Hemoglobin 11.3 (*)    HCT 36.7 (*)    Neutro Abs 10.3 (*)    Abs Immature Granulocytes 0.08 (*)    All other components  within normal limits  PROTIME-INR - Abnormal; Notable for the following components:   Prothrombin Time 18.6 (*)    INR 1.6 (*)    All other components within normal limits  URINALYSIS, ROUTINE W REFLEX MICROSCOPIC - Abnormal; Notable for the following components:   APPearance HAZY (*)    Leukocytes,Ua LARGE (*)    All other components within normal limits  APTT - Abnormal; Notable for the following components:   aPTT 39 (*)    All other components within normal limits  C-REACTIVE PROTEIN - Abnormal; Notable for the following components:   CRP 9.3 (*)    All other components within normal limits  CULTURE, BLOOD (ROUTINE X 2)  CULTURE, BLOOD (ROUTINE X 2)  CULTURE, BLOOD (SINGLE)  RESP PANEL BY RT-PCR (FLU A&B, COVID) ARPGX2  LACTIC ACID, PLASMA  SEDIMENTATION RATE    EKG None  Radiology DG Knee 2 Views Left  Result Date: 06/30/2021 CLINICAL DATA:  Left knee pain with erythema EXAM: LEFT KNEE - 1-2 VIEW COMPARISON:  None. FINDINGS: Diffuse osseous demineralization. There is a transversely oriented lucency within the mid pole of the patella with slight cortical disruption of the articular surface suspicious for a nondisplaced fracture. Small knee joint effusion  without visible fat-fluid level. Mild medial compartment joint space narrowing. Mild prepatellar soft tissue swelling. Prominent atherosclerotic vascular calcification. IMPRESSION: 1. Findings suggestive of a nondisplaced fracture through the mid pole of the patella. Correlate with point tenderness. 2. Small knee joint effusion. Electronically Signed   By: Davina Poke D.O.   On: 06/30/2021 12:15   DG Chest Port 1 View  Result Date: 06/30/2021 CLINICAL DATA:  Evaluate lung fields EXAM: PORTABLE CHEST 1 VIEW COMPARISON:  Chest x-ray dated December 11, 2020 FINDINGS: Cardiac and mediastinal contours are unchanged. Mild left basilar opacity. Possible small bilateral pleural effusions. No evidence of pneumothorax. IMPRESSION: Mild left basilar opacity, likely due to atelectasis. Small bilateral pleural effusions. Electronically Signed   By: Yetta Glassman M.D.   On: 06/30/2021 12:10    Procedures None  Medications Ordered in ED Medications  lactated ringers bolus 1,000 mL (has no administration in time range)  ketorolac (TORADOL) 15 MG/ML injection 15 mg (has no administration in time range)  lactated ringers infusion (has no administration in time range)  ceFEPIme (MAXIPIME) 2 g in sodium chloride 0.9 % 100 mL IVPB (has no administration in time range)  metroNIDAZOLE (FLAGYL) IVPB 500 mg (has no administration in time range)  vancomycin (VANCOREADY) IVPB 1500 mg/300 mL (has no administration in time range)    ED Course  I have reviewed the triage vital signs and the nursing notes.  Pertinent labs & imaging results that were available during my care of the patient were reviewed by me and considered in my medical decision making (see chart for details).    MDM Rules/Calculators/A&P                           Patient presents from SNF with complaints of left knee pain for 1 years duration.  He states that he has been requesting to be evaluated for some time now.  On presentation, the knee is  edematous, erythematous, with increased warmth though it is not tender to palpation.  Patient has 2 positive SIRS criteria with respiratory rate, heart rate.  There is also potential infection source (left knee).  Patient is afebrile however does have a leukocytosis 12.2, lactic acid 2.8.  Urinalysis  was not significant for UTI.  Differential at this time include septic arthritis of the left knee, gout or pseudogout.  ESR and CRP are pending.  X-ray of the knee showed findings suggestive of nondisplaced fracture through the midline of the patella and a small joint effusion.  Chest x-ray showed mild left basilar opacity likely secondary to atelectasis and small bilateral pleural effusions.  Code sepsis was initiated at this time including cefepime 2 g, Flagyl 500 mg IV, vancomycin 1500 mg IV to be administered as patient meets SIRS criteria though definite source of infection has not been determined.  Consult to hospitalist team placed for admission for sepsis work-up.  Final Clinical Impression(s) / ED Diagnoses Final diagnoses:  Sepsis, due to unspecified organism, unspecified whether acute organ dysfunction present [facility])    Rx / [place] Orders ED Discharge Orders     None        Farrel Gordon, DO 06/30/21 1427    Carmin Muskrat, MD 07/01/21 1609

## 2021-06-30 NOTE — Progress Notes (Signed)
A consult was received from an ED physician for Vancomycin and Cefepime per pharmacy dosing.  The patient's profile has been reviewed for ht/wt/allergies/indication/available labs.    A one time order has been placed for Vancomycin 1500mg  IV and Cefepime 2g IV.  Further antibiotics/pharmacy consults should be ordered by admitting physician if indicated.                       Thank you, Luiz Ochoa 06/30/2021  1:52 PM

## 2021-06-30 NOTE — Progress Notes (Signed)
Pharmacy Antibiotic Note  Matthew Velez is a 87 y.o. male admitted on 06/30/2021 with sepsis (possible source septic arthritis L knee vs gout).  Pharmacy has been consulted for vancomycin and cefepime dosing.  Plan: Vancomycin 1500 mg IV now, then 1000 mg IV q24 hr (est AUC 529 based on SCr rounded to 1.0; Vd 0.72) Measure vancomycin AUC at steady state as indicated SCr q48 while on vanc Cefepime 2g g IV q12 hr   Height: 5\' 5"  (165.1 cm) Weight: 77.1 kg (170 lb) IBW/kg (Calculated) : 61.5  Temp (24hrs), Avg:98.5 F (36.9 C), Min:98.5 F (36.9 C), Max:98.5 F (36.9 C)  Recent Labs  Lab 06/30/21 1205 06/30/21 1209 06/30/21 1438  WBC 12.2*  --   --   CREATININE 0.72  --   --   LATICACIDVEN  --  2.8* 2.8*    Estimated Creatinine Clearance: 47 mL/min (by C-G formula based on SCr of 0.72 mg/dL).    Allergies  Allergen Reactions   Chocolate Shortness Of Breath   Fish Allergy Anaphylaxis   Mixed Feathers Shortness Of Breath   Other Anaphylaxis    Flu shot causes Anaphylaxis   Shrimp Extract Allergy Skin Test    Iodine Rash    Antimicrobials this admission: 10/7 vancomycin  >>  10/7 cefepime >>  10/7 metronidazole >>  Dose adjustments this admission: n/a  Microbiology results: 10/7 BCx: sent    Thank you for allowing pharmacy to be a part of this patient's care.  Kaida Games A 06/30/2021 4:41 PM

## 2021-06-30 NOTE — H&P (Signed)
History and Physical    JORON VELIS MPN:[id number] DOB: 1919-06-24 DOA: 06/30/2021  PCP: Lajean Manes, MD   Patient coming from: [facility] facility.  I have personally briefly reviewed patient's old medical records in [facility]  Chief Complaint: Knee pain and swelling.  HPI: Matthew Velez is a 87 y.o. male with medical history significant of transient atrial fibrillation, hypertension, diastolic dysfunction, type II DM, history of other non-hemorrhagic CVA, prostate cancer, RLE DVT and saddle pulmonary embolus who was brought from his nursing facility due to progressively worse knee pain and edema not relieved by oxycodone.  He stated that he has had this pain for over a year but he has become more painful over the last few weeks.  He denied any recent trauma to the area.  However, he states he fell on the knee several years ago.  He denied fever, night sweats but has occasional chills.  No rhinorrhea, sore throat, dyspnea, wheezing or hemoptysis.  No chest pain, palpitations, diaphoresis, PND orthopnea.  He gets occasionally constipated, but denied abdominal pain, nausea, vomiting, diarrhea, melena or hematochezia.  No recent dysuria, frequency or hematuria.  No flank pain.  No polyuria, polydipsia, polyphagia or blurred vision.  No recent appetite changes.  He has had some trouble sleeping because of knee pain.  ED Course: Initial vital signs were temperature 98.5 F, pulse 110, respirations 20, BP 145/71 mmHg and O2 sat 95% on room air.  The patient received 15 mg Toradol IVP, LR 1000 mL IV bolus, metronidazole 500 mg IVPB vancomycin and cefepime per pharmacy.  Lab work: His urinalysis was hazy in appearance with large leukocyte esterase, but otherwise unremarkable.  CBC showed a white count of 12.2 with 84% neutrophils, hemoglobin 11.3 g/dL platelets 236.  PT was 18.6, INR 1.6 and PTT was 39.  ESR was 56 mm/h.  CRP 9.3 mg/dL.  First lactic acid was 2.8 mmol/L.  CMP showed a  glucose of 216 mg/dL, the rest of the CMP results are within normal limits after calcium is corrected to albumin level.  Imaging: A portable 1 view chest radiograph showed mild left basilar opacity likely due to atelectasis.  Small bilateral pleural effusions.  Left knee x-ray showed nondisplaced fracture of the mid pole of the patella.  There is a small knee joint effusion.  Please see images and full radiology report for further details.  Review of Systems: As per HPI otherwise all other systems reviewed and are negative.  Past Medical History:  Diagnosis Date   Atrial fibrillation, transient 02/19/2013   Benign hypertension 0/04/6760   Diastolic dysfunction 9/50/9326   DM type 2 (diabetes mellitus, type 2) ([place]) 02/19/2013   Prostate cancer ([place]) 02/19/2013   Right leg DVT ([place]) 02/19/2013   Saddle pulmonary embolus ([place]) 02/19/2013   No past surgical history on file.  Social History  reports that he has never smoked. He has never used smokeless tobacco. He reports that he does not currently use alcohol. He reports that he does not use drugs.  Allergies  Allergen Reactions   Chocolate Shortness Of Breath   Fish Allergy Anaphylaxis   Mixed Feathers Shortness Of Breath   Other Anaphylaxis    Flu shot causes Anaphylaxis   Shrimp Extract Allergy Skin Test    Iodine Rash   Family History  Problem Relation Age of Onset   Colon cancer Mother    Stroke Father    Prior to Admission medications   Medication Sig Start Date  End Date Taking? Authorizing Provider  acetaminophen (TYLENOL) 325 MG tablet Take 650 mg by mouth in the morning, at noon, and at bedtime.   Yes [provider]  acetaminophen (TYLENOL) 650 MG CR tablet Take 650 mg by mouth every 6 (six) hours as needed for pain.   Yes [provider]  alum & mag hydroxide-simeth (MAALOX PLUS) 400-400-40 MG/5ML suspension Take 30 mLs by mouth every 4 (four) hours as needed for indigestion.   Yes [provider]   apixaban (ELIQUIS) 5 MG TABS tablet Take 5 mg by mouth 2 (two) times daily.   Yes [provider]  azelastine (OPTIVAR) 0.05 % ophthalmic solution Place 1 drop into both eyes 2 (two) times daily.   Yes [provider]  carboxymethylcellulose 1 % ophthalmic solution Place 1 drop into both eyes 3 (three) times daily.   Yes [provider]  Cholecalciferol (VITAMIN D3) 50 MCG (2000 UT) TABS Take 50 mcg by mouth daily.   Yes [provider]  Cranberry-Vitamin C-Vitamin E 4200-20-3 MG-MG-UNIT CAPS Take 1 capsule by mouth daily.   Yes [provider]  diclofenac Sodium (VOLTAREN) 1 % GEL Apply 4 g topically 3 (three) times daily as needed (bilateral knee pain).   Yes [provider]  finasteride (PROSCAR) 5 MG tablet Take 5 mg by mouth at bedtime.   Yes [provider]  fluticasone (FLONASE) 50 MCG/ACT nasal spray Place 1 spray into both nostrils daily.   Yes [provider]  furosemide (LASIX) 40 MG tablet Take 1 tablet (40 mg total) by mouth daily. 12/16/18  Yes Eugenie Filler, MD  glipiZIDE (GLUCOTROL) 5 MG tablet Take 5 mg by mouth daily.    Yes [provider]  latanoprost (XALATAN) 0.005 % ophthalmic solution Place 1 drop into both eyes at bedtime.   Yes [provider]  lisinopril (PRINIVIL,ZESTRIL) 10 MG tablet Take 1 tablet (10 mg total) by mouth daily. 12/17/18  Yes Eugenie Filler, MD  loratadine (CLARITIN) 10 MG tablet Take 10 mg by mouth daily.   Yes [provider]  menthol-cetylpyridinium (CEPACOL) 3 MG lozenge Take 1 lozenge by mouth every 2 (two) hours as needed for sore throat.   Yes [provider]  metFORMIN (GLUCOPHAGE) 500 MG tablet Take 500 mg by mouth in the morning and at bedtime.   Yes [provider]  morphine 20 MG/5ML solution Take 5 mg by mouth every 4 (four) hours as needed (chest pain or shortness of breath).   Yes [provider]  omeprazole  (PRILOSEC) 20 MG capsule Take 20 mg by mouth 2 (two) times daily.    Yes [provider]  oxyCODONE (OXY IR/ROXICODONE) 5 MG immediate release tablet Take 5 mg by mouth every 4 (four) hours as needed for severe pain.   Yes [provider]  polyethylene glycol (MIRALAX / GLYCOLAX) 17 g packet Take 17 g by mouth daily.   Yes [provider]   Physical Exam: Vitals:   06/30/21 1215 06/30/21 1230 06/30/21 1300 06/30/21 1401  BP:  118/63 139/84 138/75  Pulse: 92 87 97 95  Resp: '16 15 18 16  ' Temp:      TempSrc:      SpO2: 96% 95% 99% 100%  Weight:      Height:       Constitutional: Frail, chronically ill-appearing male. Eyes: PERRL, lids and conjunctivae normal.  Sclera is anicteric. ENMT: Moderate hearing impairment.  Mucous membranes are moist. Posterior  pharynx clear of any exudate or lesions. Neck: normal, supple, no masses, no thyromegaly Respiratory: clear to auscultation bilaterally, no wheezing, no crackles. Normal respiratory effort. No accessory muscle use.  Cardiovascular: Regular rate and rhythm, no murmurs / rubs / gallops. No extremity edema. 2+ pedal pulses. No carotid bruits.  Abdomen: Obese, mildly distended.  Bowel sounds positive.  Soft, no tenderness, no masses palpated. No hepatosplenomegaly. Musculoskeletal: Moderate generalized weakness.  No clubbing / cyanosis.  Good ROM, no contractures. Normal muscle tone.  Skin: Multiple hyperpigmented and keratotic areas.  Stage II sacral pressure ulcer. Neurologic: CN 2-12 grossly intact. Sensation intact, DTR normal. Strength 5/5 in all 4.  Psychiatric: Normal judgment and insight. Alert and oriented x 3. Normal mood.   Labs on Admission: I have personally reviewed following labs and imaging studies  CBC: Recent Labs  Lab 06/30/21 1205  WBC 12.2*  NEUTROABS 10.3*  HGB 11.3*  HCT 36.7*  MCV 92.9  PLT 836   Basic Metabolic Panel: Recent Labs  Lab 06/30/21 1205  NA 135  K 3.8  CL 99  CO2  26  GLUCOSE 216*  BUN 20  CREATININE 0.72  CALCIUM 8.7*    GFR: Estimated Creatinine Clearance: 47 mL/min (by C-G formula based on SCr of 0.72 mg/dL).  Liver Function Tests: Recent Labs  Lab 06/30/21 1205  AST 25  ALT 15  ALKPHOS 81  BILITOT 1.0  PROT 7.1  ALBUMIN 3.5   Urine analysis:    Component Value Date/Time   COLORURINE YELLOW 06/30/2021 1224   APPEARANCEUR HAZY (A) 06/30/2021 1224   LABSPEC 1.019 06/30/2021 1224   PHURINE 6.0 06/30/2021 1224   GLUCOSEU NEGATIVE 06/30/2021 1224   HGBUR NEGATIVE 06/30/2021 1224   [place] 06/30/2021 1224   KETONESUR NEGATIVE 06/30/2021 1224   PROTEINUR NEGATIVE 06/30/2021 1224        NITRITE NEGATIVE 06/30/2021 1224   LEUKOCYTESUR LARGE (A) 06/30/2021 1224   Radiological Exams on Admission: DG Knee 2 Views Left  Result Date: 06/30/2021 CLINICAL DATA:  Left knee pain with erythema EXAM: LEFT KNEE - 1-2 VIEW COMPARISON:  None. FINDINGS: Diffuse osseous demineralization. There is a transversely oriented lucency within the mid pole of the patella with slight cortical disruption of the articular surface suspicious for a nondisplaced fracture. Small knee joint effusion without visible fat-fluid level. Mild medial compartment joint space narrowing. Mild prepatellar soft tissue swelling. Prominent atherosclerotic vascular calcification. IMPRESSION: 1. Findings suggestive of a nondisplaced fracture through the mid pole of the patella. Correlate with point tenderness. 2. Small knee joint effusion. Electronically Signed   By: Davina Poke D.O.   On: 06/30/2021 12:15   DG Chest Port 1 View  Result Date: 06/30/2021 CLINICAL DATA:  Evaluate lung fields EXAM: PORTABLE CHEST 1 VIEW COMPARISON:  Chest x-ray dated December 11, 2020 FINDINGS: Cardiac and mediastinal contours are unchanged. Mild left basilar opacity. Possible small bilateral pleural effusions. No evidence of pneumothorax. IMPRESSION: Mild left basilar opacity, likely due to  atelectasis. Small bilateral pleural effusions. Electronically Signed   By: Yetta Glassman M.D.   On: 06/30/2021 12:10    EKG: Independently reviewed.  Vent. rate 104 BPM PR interval * ms QRS duration 93 ms QT/QTcB 349/459 ms P-R-T axes * 71 -25 Atrial fibrillation Borderline T abnormalities, inferior leads Baseline wander in lead(s) V4  Assessment/Plan Principal Problem:   Sepsis due to undetermined organism POA (HCC) (Tachycardia, tachypnea, leukocytosis and lactic acidosis) Observation/telemetry. Continue IV fluids. Follow lactic acid level. Continue vancomycin  per pharmacy. Continue cefepime per pharmacy. Continue metronidazole 500 mg IVPB every 12 hours. Follow-up blood culture and sensitivity.  Active Problems:   Left patella fracture Analgesics as needed. Keep immobilized. In or outpatient evaluation by orthopedic surgery.    Stage 2 Pressure injury of sacral skin POA. Continue local care.    Atrial fibrillation, paroxysmal CHA?DS?-VASc Score of 8. Continue apixaban.    DM type 2 (diabetes mellitus, type 2) (HCC) Carbohydrate modified diet. Continue glipizide 10 mg p.o. daily. CBG monitoring with RI SS. Check hemoglobin A1c.    Benign hypertension Continue lisinopril 10 mg p.o. daily. Monitor BP, GFR and electrolytes.    Diastolic dysfunction No signs of decompensation. Continue ACE inhibitor. Resume diuretic in the morning.    Normocytic anemia Monitor H&H. Transfuse as needed.    DVT prophylaxis: On apixaban. Code Status:   Full code. Family Communication:   Disposition Plan:   Patient is from:  Home.  Anticipated DC to:  Home.  Anticipated DC date:  07/03/2021.  Anticipated DC barriers: Clinical status.  Consults called:   Admission status:  Inpatient/PCU.   Severity of Illness: High severity due to meeting sepsis criteria after presenting via EMS due to persistent severe left knee pain in the setting of patella fracture suspicious for  cellulitis of the area and history of recent urinary tract infection.  The patient will remain for IV antibiotic therapy for 48 to 72 hours.  Reubin Milan MD [facility]  How to contact the [facility] Attending or Consulting provider [place] or covering provider during after hours [place], for this patient?   Check the care team in [facility] and look for a) attending/consulting [facility] provider listed and b) the [facility] team listed Log into [email or web address] and use [place]'s universal password to access. If you do not have the password, please contact the hospital operator. Locate the [facility] provider you are looking for under [facility] and page to a number that you can be directly reached. If you still have difficulty reaching the provider, please page the [facility] (Director on Call) for the Hospitalists listed on amion for assistance.  06/30/2021, 2:30 PM   This document was prepared using Dragon voice recognition software and may contain some unintended transcription errors.

## 2021-06-30 NOTE — Sepsis Progress Note (Signed)
eLink is monitoring this Code Sepsis. °

## 2021-07-01 DIAGNOSIS — I5189 Other ill-defined heart diseases: Secondary | ICD-10-CM

## 2021-07-01 DIAGNOSIS — A419 Sepsis, unspecified organism: Secondary | ICD-10-CM | POA: Diagnosis not present

## 2021-07-01 DIAGNOSIS — I10 Essential (primary) hypertension: Secondary | ICD-10-CM

## 2021-07-01 DIAGNOSIS — I480 Paroxysmal atrial fibrillation: Secondary | ICD-10-CM | POA: Diagnosis not present

## 2021-07-01 DIAGNOSIS — D649 Anemia, unspecified: Secondary | ICD-10-CM

## 2021-07-01 DIAGNOSIS — S82002A Unspecified fracture of left patella, initial encounter for closed fracture: Secondary | ICD-10-CM

## 2021-07-01 DIAGNOSIS — L89152 Pressure ulcer of sacral region, stage 2: Secondary | ICD-10-CM

## 2021-07-01 DIAGNOSIS — E119 Type 2 diabetes mellitus without complications: Secondary | ICD-10-CM

## 2021-07-01 DIAGNOSIS — N390 Urinary tract infection, site not specified: Secondary | ICD-10-CM

## 2021-07-01 LAB — CBC WITH DIFFERENTIAL/PLATELET
Abs Immature Granulocytes: 0.07 10*3/uL (ref 0.00–0.07)
Basophils Absolute: 0.1 10*3/uL (ref 0.0–0.1)
Basophils Relative: 1 %
Eosinophils Absolute: 0.3 10*3/uL (ref 0.0–0.5)
Eosinophils Relative: 3 %
HCT: 32.2 % — ABNORMAL LOW (ref 39.0–52.0)
Hemoglobin: 10.5 g/dL — ABNORMAL LOW (ref 13.0–17.0)
Immature Granulocytes: 1 %
Lymphocytes Relative: 12 %
Lymphs Abs: 1.2 10*3/uL (ref 0.7–4.0)
MCH: 29.2 pg (ref 26.0–34.0)
MCHC: 32.6 g/dL (ref 30.0–36.0)
MCV: 89.7 fL (ref 80.0–100.0)
Monocytes Absolute: 0.9 10*3/uL (ref 0.1–1.0)
Monocytes Relative: 9 %
Neutro Abs: 7.7 10*3/uL (ref 1.7–7.7)
Neutrophils Relative %: 74 %
Platelets: 217 10*3/uL (ref 150–400)
RBC: 3.59 MIL/uL — ABNORMAL LOW (ref 4.22–5.81)
RDW: 15.3 % (ref 11.5–15.5)
WBC: 10.3 10*3/uL (ref 4.0–10.5)
nRBC: 0 % (ref 0.0–0.2)

## 2021-07-01 LAB — BASIC METABOLIC PANEL
Anion gap: 10 (ref 5–15)
BUN: 25 mg/dL — ABNORMAL HIGH (ref 8–23)
CO2: 24 mmol/L (ref 22–32)
Calcium: 8.5 mg/dL — ABNORMAL LOW (ref 8.9–10.3)
Chloride: 100 mmol/L (ref 98–111)
Creatinine, Ser: 0.75 mg/dL (ref 0.61–1.24)
GFR, Estimated: >60 mL/min (ref >60)
Glucose, Bld: 163 mg/dL — ABNORMAL HIGH (ref 70–99)
Potassium: 4 mmol/L (ref 3.5–5.1)
Sodium: 134 mmol/L — ABNORMAL LOW (ref 135–145)

## 2021-07-01 LAB — GLUCOSE, CAPILLARY
Glucose-Capillary: 149 mg/dL — ABNORMAL HIGH (ref 70–99)
Glucose-Capillary: 160 mg/dL — ABNORMAL HIGH (ref 70–99)
Glucose-Capillary: 116 mg/dL — ABNORMAL HIGH (ref 70–99)
Glucose-Capillary: 102 mg/dL — ABNORMAL HIGH (ref 70–99)

## 2021-07-01 LAB — HEMOGLOBIN A1C
Hgb A1c MFr Bld: 7.5 % — ABNORMAL HIGH (ref 4.8–5.6)
Mean Plasma Glucose: 168.55 mg/dL

## 2021-07-01 NOTE — Progress Notes (Addendum)
PROGRESS NOTE  Matthew Velez HLK:[id number] DOB: 12-23-1918 DOA: 06/30/2021 PCP: Lajean Manes, MD   LOS: 1 day   Brief narrative: Matthew Velez is a 87 y.o. male with medical history significant of transient atrial fibrillation, hypertension, diastolic dysfunction, type II DM, history of other non-hemorrhagic CVA, prostate cancer, RLE DVT and saddle pulmonary embolus was brought into the hospital from nursing home with progressive knee pain and edema not relieved with oxycodone.  No recent history of trauma but had failed few years back.  In the ED, showed large leukocyte esterase.  Leukocytosis was present with 84% neutrophils.  ESR was elevated.  CMP showed glucose of 216.  Chest x-ray showed mild left basilar opacity due to atelectasis.  Left knee x-ray showed nondisplaced fracture of the mid pole of the patella with knee joint effusion.   Assessment/Plan:  Principal Problem:   Sepsis due to undetermined organism [facility]) Active Problems:   Atrial fibrillation, transient   DM type 2 (diabetes mellitus, type 2) (HCC)   Benign hypertension   Diastolic dysfunction   Normocytic anemia   Left patella fracture   Pressure injury of skin  Possible sepsis.  Abnormal urinalysis.  Patient had tachycardia tachypnea leukocytosis and elevated lactate on presentation.  On vancomycin and cefepime.  Follow blood cultures  and sensitivity.  Lactate has improved at this time.  Off antibiotic.  Initially received septic fluid bolus.  Chest x-ray shows a possible basilar atelectasis.  Add urine culture stat    Left patella fracture Continue knee immobilizer.  If worsening situation increasing erythema edema will consider orthopedics for reevaluation.    Stage 2 Pressure injury of sacral skin POA. Consult wound care.     Atrial fibrillation, paroxysmal CHA?DS?-VASc Score of 8.  Currently on apixaban.  Rate controlled at this time     DM type 2  On glipizide and metformin as outpatient.  Will hold for  now.  Continue with sliding scale insulin.  Globin A1c noted..  Closely monitor.       Benign hypertension Continue Lisinopril.  Blood pressure seems to be stable at this time.     Diastolic dysfunction On ACE inhibitor.     Normocytic anemia Monitor CBC closely.  Latest hemoglobin of 10.5.  Left buttocks stage II pressure ulceration.  Present on admission.  Continue wound care.  Debility, deconditioning.  We will get PT evaluation.   DVT prophylaxis:  apixaban (ELIQUIS) tablet 5 mg    Code Status: DNR  Family Communication: None   Status is: Inpatient  Remains inpatient appropriate because:Unsafe d/c plan, IV treatments appropriate due to intensity of illness or inability to take PO, and Inpatient level of care appropriate due to severity of illness  Dispo: The patient is from: SNF              Anticipated d/c is to: SNF              Patient currently is not medically stable to d/c.   Difficult to place patient No   Consultants: None so far  Procedures: None  Anti-infectives:  Vancomycin and cefepime  Anti-infectives (From admission, onward)    Start     Dose/Rate Route Frequency Ordered Stop   07/01/21 1400  vancomycin (VANCOCIN) IVPB 1000 mg/200 mL premix        1,000 mg 200 mL/hr over 60 Minutes Intravenous Every 24 hours 06/30/21 1638     07/01/21 0200  metroNIDAZOLE (FLAGYL) IVPB 500 mg  500 mg 100 mL/hr over 60 Minutes Intravenous Every 12 hours 06/30/21 1424 09-Jul-2021 0159   06/30/21 2200  ceFEPIme (MAXIPIME) 2 g in sodium chloride 0.9 % 100 mL IVPB        2 g 200 mL/hr over 30 Minutes Intravenous Every 12 hours 06/30/21 1638     06/30/21 1400  ceFEPIme (MAXIPIME) 2 g in sodium chloride 0.9 % 100 mL IVPB        2 g 200 mL/hr over 30 Minutes Intravenous  Once 06/30/21 1345 06/30/21 1459   06/30/21 1400  metroNIDAZOLE (FLAGYL) IVPB 500 mg        500 mg 100 mL/hr over 60 Minutes Intravenous  Once 06/30/21 1345 06/30/21 1535   06/30/21 1400   vancomycin (VANCOCIN) IVPB 1000 mg/200 mL premix  Status:  Discontinued        1,000 mg 200 mL/hr over 60 Minutes Intravenous  Once 06/30/21 1345 06/30/21 1352   06/30/21 1400  vancomycin (VANCOREADY) IVPB 1500 mg/300 mL        1,500 mg 150 mL/hr over 120 Minutes Intravenous STAT 06/30/21 1352 06/30/21 1636      Subjective: Today, patient was seen and examined at bedside.  Patient states that he has back pain and wishes to sit up.  Complains of pain in his left knee.  Objective: Vitals:   07/01/21 0030 07/01/21 0440  BP: 131/84 112/60  Pulse: 100 (!) 105  Resp: 18 20  Temp: 98.1 F (36.7 C) 97.6 F (36.4 C)  SpO2: 95% 94%    Intake/Output Summary (Last 24 hours) at 07/01/2021 1139 Last data filed at 07/01/2021 0500 Gross per 24 hour  Intake 823.26 ml  Output 300 ml  Net 523.26 ml   Filed Weights   06/30/21 1141 06/30/21 1723  Weight: 77.1 kg 82.9 kg   Body mass index is 30.42 kg/m.   Physical Exam: GENERAL: Patient is alert awake and communicative,, not in obvious distress.  Frail, chronically ill male, hard of hearing. HENT: No scleral pallor or icterus. Pupils equally reactive to light. Oral mucosa is moist NECK: is supple, no gross swelling noted. CHEST: Clear to auscultation. No crackles or wheezes.  Diminished breath sounds bilaterally. CVS: S1 and S2 heard, no murmur. Regular rate and rhythm.  ABDOMEN: Soft, non-tender, bowel sounds are present. EXTREMITIES: No edema.  Generalized weakness noted.  Left knee with mild erythema and tenderness. CNS: Cranial nerves are intact.  Moving all extremities, SKIN: warm and dry without rashes.  Data Review: I have personally reviewed the following laboratory data and studies,  CBC: Recent Labs  Lab 06/30/21 1205 07/01/21 0426  WBC 12.2* 10.3  NEUTROABS 10.3* 7.7  HGB 11.3* 10.5*  HCT 36.7* 32.2*  MCV 92.9 89.7  PLT 236 250   Basic Metabolic Panel: Recent Labs  Lab 06/30/21 1205 07/01/21 0426  NA 135 134*   K 3.8 4.0  CL 99 100  CO2 26 24  GLUCOSE 216* 163*  BUN 20 25*  CREATININE 0.72 0.75  CALCIUM 8.7* 8.5*   Liver Function Tests: Recent Labs  Lab 06/30/21 1205  AST 25  ALT 15  ALKPHOS 81  BILITOT 1.0  PROT 7.1  ALBUMIN 3.5   No results for input(s): LIPASE, AMYLASE in the last 168 hours. No results for input(s): AMMONIA in the last 168 hours. Cardiac Enzymes: No results for input(s): CKTOTAL, CKMB, CKMBINDEX, TROPONINI in the last 168 hours. BNP (last 3 results) No results for input(s): BNP in the last 8760  hours.  ProBNP (last 3 results) No results for input(s): PROBNP in the last 8760 hours.  CBG: Recent Labs  Lab 06/30/21 1904 07/01/21 0738  GLUCAP 117* 149*   Recent Results (from the past 240 hour(s))  Resp Panel by RT-PCR (Flu A&B, Covid) Nasopharyngeal Swab     Status: None   Collection Time: 06/30/21  2:38 PM   Specimen: Nasopharyngeal Swab; Nasopharyngeal(NP) swabs in vial transport medium  Result Value Ref Range Status   SARS Coronavirus 2 by RT PCR NEGATIVE NEGATIVE Final    Comment: (NOTE) SARS-CoV-2 target nucleic acids are NOT DETECTED.  The SARS-CoV-2 RNA is generally detectable in upper respiratory specimens during the acute phase of infection. The lowest concentration of SARS-CoV-2 viral copies this assay can detect is 138 copies/mL. A negative result does not preclude SARS-Cov-2 infection and should not be used as the sole basis for treatment or other patient management decisions. A negative result may occur with  improper specimen collection/handling, submission of specimen other than nasopharyngeal swab, presence of viral mutation(s) within the areas targeted by this assay, and inadequate number of viral copies(<138 copies/mL). A negative result must be combined with clinical observations, patient history, and epidemiological information. The expected result is Negative.  Fact Sheet for Patients:   EntrepreneurPulse.com.au  Fact Sheet for Healthcare Providers:  IncredibleEmployment.be  This test is no t yet approved or cleared by the [place] FDA and  has been authorized for detection and/or diagnosis of SARS-CoV-2 by FDA under an Emergency Use Authorization (EUA). This EUA will remain  in effect (meaning this test can be used) for the duration of the COVID-19 declaration under Section 564(b)(1) of the Act, 21 U.S.C.section 360bbb-3(b)(1), unless the authorization is terminated  or revoked sooner.       Influenza A by PCR NEGATIVE NEGATIVE Final   Influenza B by PCR NEGATIVE NEGATIVE Final    Comment: (NOTE) The Xpert Xpress SARS-CoV-2/FLU/RSV plus assay is intended as an aid in the diagnosis of influenza from Nasopharyngeal swab specimens and should not be used as a sole basis for treatment. Nasal washings and aspirates are unacceptable for Xpert Xpress SARS-CoV-2/FLU/RSV testing.  Fact Sheet for Patients: EntrepreneurPulse.com.au  Fact Sheet for Healthcare Providers: IncredibleEmployment.be  This test is not yet approved or cleared by the [place] FDA and has been authorized for detection and/or diagnosis of SARS-CoV-2 by FDA under an Emergency Use Authorization (EUA). This EUA will remain in effect (meaning this test can be used) for the duration of the COVID-19 declaration under Section 564(b)(1) of the Act, 21 U.S.C. section 360bbb-3(b)(1), unless the authorization is terminated or revoked.  Performed at [facility], [place] [address] [place], [place] [address]      Studies: DG Knee 2 Views Left  Result Date: 06/30/2021 CLINICAL DATA:  Left knee pain with erythema EXAM: LEFT KNEE - 1-2 VIEW COMPARISON:  None. FINDINGS: Diffuse osseous demineralization. There is a transversely oriented lucency within the mid pole of the patella with slight cortical disruption of  the articular surface suspicious for a nondisplaced fracture. Small knee joint effusion without visible fat-fluid level. Mild medial compartment joint space narrowing. Mild prepatellar soft tissue swelling. Prominent atherosclerotic vascular calcification. IMPRESSION: 1. Findings suggestive of a nondisplaced fracture through the mid pole of the patella. Correlate with point tenderness. 2. Small knee joint effusion. Electronically Signed   By: Davina Poke D.O.   On: 06/30/2021 12:15   DG Chest Port 1 View  Result Date: 06/30/2021 CLINICAL  DATA:  Evaluate lung fields EXAM: PORTABLE CHEST 1 VIEW COMPARISON:  Chest x-ray dated December 11, 2020 FINDINGS: Cardiac and mediastinal contours are unchanged. Mild left basilar opacity. Possible small bilateral pleural effusions. No evidence of pneumothorax. IMPRESSION: Mild left basilar opacity, likely due to atelectasis. Small bilateral pleural effusions. Electronically Signed   By: Yetta Glassman M.D.   On: 06/30/2021 12:10      Flora Lipps, MD  [facility] 07/01/2021  If 7PM-7AM, please contact night-coverage

## 2021-07-01 NOTE — Consult Note (Addendum)
WOC Nurse Consult Note: Patient receiving care in [place] [facility] Reason for Consult: sacral wound Wound type: stage 2 to left buttocks according to the flowsheet documentation Pressure Injury POA: Yes/No/NA Measurement: To be provided by the bedside RN in the flowsheet section  Wound bed: To be provided by the bedside RN in the flowsheet section  Drainage (amount, consistency, odor)  Periwound: Dressing procedure/placement/frequency: I have enacted the foam dressing order contained within the Skin Care Standing  Order Set available to all licensed nurses in the [facility] system for Stage 2 pressure injuries.  Monitor the wound area(s) for worsening of condition such as: Signs/symptoms of infection,  Increase in size,  Development of or worsening of odor, Development of pain, or increased pain at the affected locations.  Notify the medical team if any of these develop.  Thank you for the consult. [place] nurse will not follow at this time.  Please re-consult the [place] team if needed.  Val Riles, RN, MSN, CWOCN, CNS-BC, pager [phone]

## 2021-07-01 NOTE — Consult Note (Addendum)
ORTHOPAEDIC CONSULTATION  REQUESTING PHYSICIAN: Flora Lipps, MD  Chief Complaint: left knee pain  HPI: Matthew Velez is a 87 y.o. male transient atrial fibrillation, hypertension, type 2 diabetes, history of CVA, prostate cancer, history of right leg DVT and saddle pulmonary embolus who complains of  left knee pain.  He has had knee pain for the last year but is gotten worse over the last few days.  He has no history of any recent falls or trauma but did have a few falls a few years ago. Denies pain at rest but pain is severe with any movement of the left knee. States he has no sensation below the left knee, this has been this way for many years, he has seen a neurologist in the past but nothing was done about it. He walks with a walker at home at baseline. Denies any other joint pain.  Denies any chest pain, nausea, vomiting, or abdominal pain.  Denies any recent fevers or chills.  No history of septic arthritis.  No history of gout.  Past Medical History:  Diagnosis Date   Atrial fibrillation, transient 02/19/2013   Benign hypertension 6/81/2751   Diastolic dysfunction 7/00/1749   DM type 2 (diabetes mellitus, type 2) ([place]) 02/19/2013   Infarction of right basal ganglia ([place]) 06/30/2021   Prostate cancer ([place]) 02/19/2013   Right leg DVT ([place]) 02/19/2013   Saddle pulmonary embolus ([place]) 02/19/2013   History reviewed. No pertinent surgical history. Social History   Socioeconomic History   Marital status: Married    Spouse name: Manufacturing systems engineer   Number of children: 1   Years of education: 12   Highest education level: 12th grade  Occupational History   Occupation: retired  Tobacco Use   Smoking status: Never   Smokeless tobacco: Never  Vaping Use   Vaping Use: Never used  Substance and Sexual Activity   Alcohol use: Not Currently   Drug use: Never   Sexual activity: Not Currently    Partners: Male  Other Topics Concern   Not on file  Social History Narrative   Not on  file   Social Determinants of Health   Financial Resource Strain: Not on file  Food Insecurity: Not on file  Transportation Needs: Not on file  Physical Activity: Not on file  Stress: Not on file  Social Connections: Not on file   Family History  Problem Relation Age of Onset   Colon cancer Mother    Stroke Father    Allergies  Allergen Reactions   Chocolate Shortness Of Breath   Fish Allergy Anaphylaxis   Mixed Feathers Shortness Of Breath   Other Anaphylaxis    Flu shot causes Anaphylaxis   Shrimp Extract Allergy Skin Test    Iodine Rash   Positive ROS: All other systems have been reviewed and were otherwise negative with the exception of those mentioned in the HPI and as above.  Physical Exam: General: Alert, frail, elderly male who is hard of hearing. Sitting up in bed, in no acute distress. Cardiovascular: No pedal edema Respiratory: No cyanosis, no use of accessory musculature GI: No organomegaly, abdomen is mildly distended and non-tender Skin: See photos below Neurologic: as documented below Psychiatric: Patient with normal mood and affect Lymphatic: No axillary or cervical lymphadenopathy  MUSCULOSKELETAL:  LLE -see photo below, erythema mainly located over the patella.  He is point tender to palpation to all aspects of the patella.  No medial or lateral joint line tenderness.  No posterior  tenderness palpation.  Mild effusion but no significant fluid pockets palpated.  Able to dorsiflex and plantarflex at the ankle.  Able to flex and extend all toes.  With both eyes closed and eyes open testing his sensation, denies any sensation below the joint line of his knee, denies sensation to any aspects of lower leg, ankle or foot (states this is no change from his baseline sensation). + DP pulse and PT pulse found with doppler. Calf soft. RLE - + DP pulse, denies any sensation distal to medial and lateral malleoli, is able to differentiate sensation to medial and lateral  lower leg with eyes closed. No pain with active flexion and extension at knee. Dorsiflexion and plantarflexion intact. Calf soft.      3 views of left knee show nondisplaced fracture of left patella, small knee joint effusion  Assessment/Plan: Left non-displaced patella fracture: - due to exam along with normalized WBC today, elevation in lactic acid which has normalized, lack of fever, would recommend closed treatment of patella fracture without aspiration of the knee at this time - knee immobilizer was on but did not fit well, this has been cut and refitted. If this is bothersome or sliding down his leg, we will fit him with a hinged bledsoe brace locked in extension. Bed was readjusted, would recommend keeping lower half of hospital bed level to the floor to not encourage bending at the knee - ok to WBAT with knee immobilizer in place, PT and OT evals have been ordered   Ventura Bruns, PA-C    07/01/2021 4:38 PM

## 2021-07-01 NOTE — Progress Notes (Signed)
Called re patella fracture.  Very unlikely to be a source of sepsis, minimal displacement, plan for knee immobilizer, WBAT with PT, ok to anticoagulate, no surgery anticipated.  Normal WBC, slight elevation in lactic acid which normalized, no fevers, I would not recommend aspiration of the knee, but rather closed treatment of the patella fracture.    Full consult to follow.    Johnny Bridge, MD

## 2021-07-02 ENCOUNTER — Inpatient Hospital Stay (HOSPITAL_COMMUNITY): Payer: Medicare HMO

## 2021-07-02 DIAGNOSIS — I10 Essential (primary) hypertension: Secondary | ICD-10-CM | POA: Diagnosis not present

## 2021-07-02 DIAGNOSIS — I480 Paroxysmal atrial fibrillation: Secondary | ICD-10-CM | POA: Diagnosis not present

## 2021-07-02 DIAGNOSIS — A419 Sepsis, unspecified organism: Secondary | ICD-10-CM | POA: Diagnosis not present

## 2021-07-02 DIAGNOSIS — I5189 Other ill-defined heart diseases: Secondary | ICD-10-CM | POA: Diagnosis not present

## 2021-07-02 LAB — CBC
HCT: 33.1 % — ABNORMAL LOW (ref 39.0–52.0)
Hemoglobin: 10.5 g/dL — ABNORMAL LOW (ref 13.0–17.0)
MCH: 28.6 pg (ref 26.0–34.0)
MCHC: 31.7 g/dL (ref 30.0–36.0)
MCV: 90.2 fL (ref 80.0–100.0)
Platelets: 234 10*3/uL (ref 150–400)
RBC: 3.67 MIL/uL — ABNORMAL LOW (ref 4.22–5.81)
RDW: 15 % (ref 11.5–15.5)
WBC: 10.9 10*3/uL — ABNORMAL HIGH (ref 4.0–10.5)
nRBC: 0 % (ref 0.0–0.2)

## 2021-07-02 LAB — BASIC METABOLIC PANEL
Anion gap: 11 (ref 5–15)
BUN: 21 mg/dL (ref 8–23)
CO2: 23 mmol/L (ref 22–32)
Calcium: 8.8 mg/dL — ABNORMAL LOW (ref 8.9–10.3)
Chloride: 106 mmol/L (ref 98–111)
Creatinine, Ser: 0.84 mg/dL (ref 0.61–1.24)
GFR, Estimated: >60 mL/min (ref >60)
Glucose, Bld: 153 mg/dL — ABNORMAL HIGH (ref 70–99)
Potassium: 4 mmol/L (ref 3.5–5.1)
Sodium: 140 mmol/L (ref 135–145)

## 2021-07-02 LAB — GLUCOSE, CAPILLARY
Glucose-Capillary: 144 mg/dL — ABNORMAL HIGH (ref 70–99)
Glucose-Capillary: 178 mg/dL — ABNORMAL HIGH (ref 70–99)
Glucose-Capillary: 120 mg/dL — ABNORMAL HIGH (ref 70–99)
Glucose-Capillary: 133 mg/dL — ABNORMAL HIGH (ref 70–99)

## 2021-07-02 LAB — MAGNESIUM: Magnesium: 2.4 mg/dL (ref 1.7–2.4)

## 2021-07-02 MED ORDER — POTASSIUM CHLORIDE CRYS ER 20 MEQ PO TBCR
40.0000 meq | EXTENDED_RELEASE_TABLET | Freq: Two times a day (BID) | ORAL | Status: AC
  Administered 2021-07-02 (×2): 40 meq via ORAL
  Filled 2021-07-02 (×2): qty 2

## 2021-07-02 MED ORDER — DOCUSATE SODIUM 100 MG PO CAPS
100.0000 mg | ORAL_CAPSULE | Freq: Two times a day (BID) | ORAL | Status: DC
  Administered 2021-07-02 – 2021-07-03 (×4): 100 mg via ORAL
  Filled 2021-07-02 (×4): qty 1

## 2021-07-02 MED ORDER — SIMETHICONE 40 MG/0.6ML PO SUSP
40.0000 mg | Freq: Four times a day (QID) | ORAL | Status: DC | PRN
  Filled 2021-07-02: qty 0.6

## 2021-07-02 NOTE — Progress Notes (Signed)
PROGRESS NOTE  Matthew Velez PRF:[id number] DOB: April 02, 1919 DOA: 06/30/2021 PCP: Lajean Manes, MD   LOS: 2 days   Brief narrative:  Matthew Velez is a 87 y.o. male with medical history significant of transient atrial fibrillation, hypertension, diastolic dysfunction, type II DM, history of other non-hemorrhagic CVA, prostate cancer, RLE DVT and saddle pulmonary embolus was brought into the hospital from nursing home with progressive knee pain and edema not relieved with oxycodone.  No recent history of trauma but had failed few years back.  In the ED, showed large leukocyte esterase.  Leukocytosis was present with 84% neutrophils.  ESR was elevated.  CMP showed glucose of 216.  Chest x-ray showed mild left basilar opacity due to atelectasis.  Left knee x-ray showed nondisplaced fracture of the mid pole of the patella with knee joint effusion.   Assessment/Plan:  Principal Problem:   Sepsis due to undetermined organism [facility]) Active Problems:   Atrial fibrillation, transient   DM type 2 (diabetes mellitus, type 2) (HCC)   Benign hypertension   Diastolic dysfunction   Normocytic anemia   Left patella fracture   Pressure injury of skin  Possible sepsis.  Abnormal urinalysis.  On vancomycin and cefepime.  Follow blood cultures  and sensitivity.  Urine culture pending.  Lactate has improved at this time.  Off antibiotic.  Initially received septic fluid bolus.  Chest x-ray shows  possible basilar atelectasis.  Mild leukocytosis today.  Temperature max of 98.1 F    Left patella fracture Continue knee immobilizer.  Seen by orthopedic.  Orthopedic recommended conservative treatment at this time.      Stage 2 Pressure injury of sacral skin POA. Continue wound care     Atrial fibrillation, paroxysmal CHA?DS?-VASc Score of 8.  Currently on apixaban.  Rate controlled at this time     DM type 2  On glipizide and metformin as outpatient.  Hold for now, continue with sliding scale insulin.   Recent hemoglobin A1c of 7.5.  Closely monitor.       Benign hypertension Controlled on lisinopril.     Diastolic dysfunction Continue lisinopril.     Normocytic anemia Monitor CBC closely.  Latest hemoglobin of 10.5.  Left buttocks stage II pressure ulceration.  Present on admission.  Continue wound care.  Debility, deconditioning.  Okay to ambulate with immobilizer, physical therapy evaluation pending  Abdominal distention. Poor oral intake. Will get Xray Kub. If negative consider bowel regimen  DVT prophylaxis:  apixaban (ELIQUIS) tablet 5 mg    Code Status: DNR  Family Communication:  none  Status is: Inpatient  Remains inpatient appropriate because:Unsafe d/c plan, IV treatments appropriate due to intensity of illness or inability to take PO, and Inpatient level of care appropriate due to severity of illness  Dispo: The patient is from: SNF              Anticipated d/c is to: SNF, PT pending              Patient currently is not medically stable to d/c.   Difficult to place patient No   Consultants: Orthopedics  Procedures: Knee brace  Anti-infectives:  Vancomycin and cefepime 10/7>  Anti-infectives (From admission, onward)    Start     Dose/Rate Route Frequency Ordered Stop   07/01/21 1400  vancomycin (VANCOCIN) IVPB 1000 mg/200 mL premix        1,000 mg 200 mL/hr over 60 Minutes Intravenous Every 24 hours 06/30/21 1638  07/01/21 0200  metroNIDAZOLE (FLAGYL) IVPB 500 mg        500 mg 100 mL/hr over 60 Minutes Intravenous Every 12 hours 06/30/21 1424 07/29/21 0159   06/30/21 2200  ceFEPIme (MAXIPIME) 2 g in sodium chloride 0.9 % 100 mL IVPB        2 g 200 mL/hr over 30 Minutes Intravenous Every 12 hours 06/30/21 1638     06/30/21 1400  ceFEPIme (MAXIPIME) 2 g in sodium chloride 0.9 % 100 mL IVPB        2 g 200 mL/hr over 30 Minutes Intravenous  Once 06/30/21 1345 06/30/21 1459   06/30/21 1400  metroNIDAZOLE (FLAGYL) IVPB 500 mg        500 mg 100  mL/hr over 60 Minutes Intravenous  Once 06/30/21 1345 06/30/21 1535   06/30/21 1400  vancomycin (VANCOCIN) IVPB 1000 mg/200 mL premix  Status:  Discontinued        1,000 mg 200 mL/hr over 60 Minutes Intravenous  Once 06/30/21 1345 06/30/21 1352   06/30/21 1400  vancomycin (VANCOREADY) IVPB 1500 mg/300 mL        1,500 mg 150 mL/hr over 120 Minutes Intravenous STAT 06/30/21 1352 06/30/21 1636      Subjective: Today, patient was seen and examined at bedside. States hadn't had a breakfast. Denies nausea or vomiting. No fever or cough but has some shortness of breath.   Objective: Vitals:   07/01/21 2051 07/02/21 0455  BP: 135/68 122/84  Pulse: 95 95  Resp:    Temp: 98.1 F (36.7 C) 97.9 F (36.6 C)  SpO2: 100% 95%    Intake/Output Summary (Last 24 hours) at 07/02/2021 0904 Last data filed at 07/02/2021 0855 Gross per 24 hour  Intake 875 ml  Output 200 ml  Net 675 ml    Filed Weights   06/30/21 1141 06/30/21 1723  Weight: 77.1 kg 82.9 kg   Body mass index is 30.42 kg/m.   Physical Exam: GENERAL: Patient is alert awake and communicative,, not in obvious distress.  Frail, chronically ill male, hard of hearing. obese HENT: No scleral pallor or icterus. Pupils equally reactive to light. Oral mucosa is moist NECK: is supple, no gross swelling noted. CHEST: Clear to auscultation. No crackles or wheezes.  Diminished breath sounds bilaterally. CVS: S1 and S2 heard, no murmur. Regular rate and rhythm.  ABDOMEN: Soft, non-tender, bowel sounds are present. Mildly distended and tympanic EXTREMITIES: No edema.  Generalized weakness noted.  Left knee with mild erythema and tenderness on immobilizer CNS: Cranial nerves are intact.  Moving all extremities, SKIN: warm and dry without rashes.  Data Review: I have personally reviewed the following laboratory data and studies,  CBC: Recent Labs  Lab 06/30/21 1205 07/01/21 0426 07/02/21 0414  WBC 12.2* 10.3 10.9*  NEUTROABS 10.3* 7.7   --   HGB 11.3* 10.5* 10.5*  HCT 36.7* 32.2* 33.1*  MCV 92.9 89.7 90.2  PLT 236 217 660    Basic Metabolic Panel: Recent Labs  Lab 06/30/21 1205 07/01/21 0426 07/02/21 0414  NA 135 134* 140  K 3.8 4.0 4.0  CL 99 100 106  CO2 _0 GLUCOSE 216* 163* 153*  BUN 20 25* 21  CREATININE 0.72 0.75 0.84  CALCIUM 8.7* 8.5* 8.8*  MG  --   --  2.4    Liver Function Tests: Recent Labs  Lab 06/30/21 1205  AST 25  ALT 15  ALKPHOS 81  BILITOT 1.0  PROT 7.1  ALBUMIN  3.5    No results for input(s): LIPASE, AMYLASE in the last 168 hours. No results for input(s): AMMONIA in the last 168 hours. Cardiac Enzymes: No results for input(s): CKTOTAL, CKMB, CKMBINDEX, TROPONINI in the last 168 hours. BNP (last 3 results) No results for input(s): BNP in the last 8760 hours.  ProBNP (last 3 results) No results for input(s): PROBNP in the last 8760 hours.  CBG: Recent Labs  Lab 07/01/21 0738 07/01/21 1140 07/01/21 1614 07/01/21 2057 07/02/21 0759  GLUCAP 149* 160* 116* 102* 144*    Recent Results (from the past 240 hour(s))  Culture, blood (Routine x 2)     Status: None (Preliminary result)   Collection Time: 06/30/21 12:03 PM   Specimen: BLOOD  Result Value Ref Range Status   Specimen Description   Final    BLOOD RIGHT ANTECUBITAL Performed at [facility], [place] [address] [place], [place] [address]    Special Requests   Final    BOTTLES DRAWN AEROBIC AND ANAEROBIC Blood Culture results may not be optimal due to an excessive volume of blood received in culture bottles Performed at [place] [address], [place], [place] [address]    Culture   Final    NO GROWTH 1 DAY Performed at [facility], [place] [address], [place], [place] [address]    Report Status PENDING  Incomplete  Culture, blood (Routine x 2)     Status: None (Preliminary result)   Collection Time: 06/30/21 12:09 PM   Specimen: BLOOD  Result Value Ref Range  Status   Specimen Description   Final    BLOOD LEFT ANTECUBITAL Performed at [place] [address] [place], [place] [address]    Special Requests   Final    BOTTLES DRAWN AEROBIC AND ANAEROBIC Blood Culture results may not be optimal due to an excessive volume of blood received in culture bottles Performed at [facility] [address] [place], [place] [address]    Culture   Final    NO GROWTH 1 DAY Performed at [facility], [place] [address] [place], [place] [address]    Report Status PENDING  Incomplete  Resp Panel by RT-PCR (Flu A&B, Covid) Nasopharyngeal Swab     Status: None   Collection Time: 06/30/21  2:38 PM   Specimen: Nasopharyngeal Swab; Nasopharyngeal(NP) swabs in vial transport medium  Result Value Ref Range Status   SARS Coronavirus 2 by RT PCR NEGATIVE NEGATIVE Final    Comment: (NOTE) SARS-CoV-2 target nucleic acids are NOT DETECTED.  The SARS-CoV-2 RNA is generally detectable in upper respiratory specimens during the acute phase of infection. The lowest concentration of SARS-CoV-2 viral copies this assay can detect is 138 copies/mL. A negative result does not preclude SARS-Cov-2 infection and should not be used as the sole basis for treatment or other patient management decisions. A negative result may occur with  improper specimen collection/handling, submission of specimen other than nasopharyngeal swab, presence of viral mutation(s) within the areas targeted by this assay, and inadequate number of viral copies(<138 copies/mL). A negative result must be combined with clinical observations, patient history, and epidemiological information. The expected result is Negative.  Fact Sheet for Patients:  EntrepreneurPulse.com.au  Fact Sheet for Healthcare Providers:  IncredibleEmployment.be  This test is no t yet approved or cleared by the [place] FDA and  has been  authorized for detection and/or diagnosis of SARS-CoV-2 by FDA under an  Emergency Use Authorization (EUA). This EUA will remain  in effect (meaning this test can be used) for the duration of the COVID-19 declaration under Section 564(b)(1) of the Act, 21 U.S.C.section 360bbb-3(b)(1), unless the authorization is terminated  or revoked sooner.       Influenza A by PCR NEGATIVE NEGATIVE Final   Influenza B by PCR NEGATIVE NEGATIVE Final    Comment: (NOTE) The Xpert Xpress SARS-CoV-2/FLU/RSV plus assay is intended as an aid in the diagnosis of influenza from Nasopharyngeal swab specimens and should not be used as a sole basis for treatment. Nasal washings and aspirates are unacceptable for Xpert Xpress SARS-CoV-2/FLU/RSV testing.  Fact Sheet for Patients: EntrepreneurPulse.com.au  Fact Sheet for Healthcare Providers: IncredibleEmployment.be  This test is not yet approved or cleared by the [place] FDA and has been authorized for detection and/or diagnosis of SARS-CoV-2 by FDA under an Emergency Use Authorization (EUA). This EUA will remain in effect (meaning this test can be used) for the duration of the COVID-19 declaration under Section 564(b)(1) of the Act, 21 U.S.C. section 360bbb-3(b)(1), unless the authorization is terminated or revoked.  Performed at [facility], [place] [address] [place], [place] [address]       Studies: DG Knee 2 Views Left  Result Date: 06/30/2021 CLINICAL DATA:  Left knee pain with erythema EXAM: LEFT KNEE - 1-2 VIEW COMPARISON:  None. FINDINGS: Diffuse osseous demineralization. There is a transversely oriented lucency within the mid pole of the patella with slight cortical disruption of the articular surface suspicious for a nondisplaced fracture. Small knee joint effusion without visible fat-fluid level. Mild medial compartment joint space narrowing. Mild prepatellar soft tissue swelling.  Prominent atherosclerotic vascular calcification. IMPRESSION: 1. Findings suggestive of a nondisplaced fracture through the mid pole of the patella. Correlate with point tenderness. 2. Small knee joint effusion. Electronically Signed   By: Davina Poke D.O.   On: 06/30/2021 12:15   DG Chest Port 1 View  Result Date: 06/30/2021 CLINICAL DATA:  Evaluate lung fields EXAM: PORTABLE CHEST 1 VIEW COMPARISON:  Chest x-ray dated December 11, 2020 FINDINGS: Cardiac and mediastinal contours are unchanged. Mild left basilar opacity. Possible small bilateral pleural effusions. No evidence of pneumothorax. IMPRESSION: Mild left basilar opacity, likely due to atelectasis. Small bilateral pleural effusions. Electronically Signed   By: Yetta Glassman M.D.   On: 06/30/2021 12:10      Flora Lipps, MD  [facility] 07/02/2021  If 7PM-7AM, please contact night-coverage

## 2021-07-02 NOTE — Evaluation (Signed)
Occupational Therapy Evaluation Patient Details Name: Matthew Velez MRN: [id number] DOB: 1919-02-09 Today's Date: 07/02/2021   History of Present Illness Matthew Velez is a 87 y.o. male with medical history significant of transient atrial fibrillation, hypertension, diastolic dysfunction, type II DM, history of other non-hemorrhagic CVA, prostate cancer, RLE DVT and saddle pulmonary embolus was brought into the hospital from nursing home with progressive knee pain and edema not relieved with oxycodone. Left knee x-ray showed nondisplaced fracture of the mid pole of the patella with knee joint effusion.   Clinical Impression   [name] is a 87 year old man typically independent with BADLs (except for showering) and modified independent with rollator at a local ALF. On evaluation patient presents with decreased ROM and strength and immobilization of LLE, generalized weakness despite good upper body strength, decreased activity tolerance, impaired cardiopulmonary endurance and balance. Patient max x 2 to stand and unable to take an actual step. He was only able to stand less than 30 seconds on first stand and less than 10 seconds on second stand. Patient currently set up for UB ADLs and max-total assist for toileting and LB ADLs. Patient is mildly dyspneic at rest which increases and becomes wheezy with activity though o2 sats maintain in functional range. Patient will benefit from skilled OT services while in hospital to improve deficits and learn compensatory strategies as needed in order to return to PLOF.  Recommend short term rehab at discharge.      Recommendations for follow up therapy are one component of a multi-disciplinary discharge planning process, led by the attending physician.  Recommendations may be updated based on patient status, additional functional criteria and insurance authorization.   Follow Up Recommendations  SNF    Equipment Recommendations  None recommended by OT     Recommendations for Other Services       Precautions / Restrictions Precautions Precautions: Fall Required Braces or Orthoses: Knee Immobilizer - Left Knee Immobilizer - Left: On at all times Restrictions Weight Bearing Restrictions: Yes LLE Weight Bearing: Weight bearing as tolerated      Mobility Bed Mobility               General bed mobility comments: Rn reports max x 2 for bed mobility    Transfers Overall transfer level: Needs assistance Equipment used: Rolling walker (2 wheeled) Transfers: Sit to/from Stand Sit to Stand: Max assist;+2 physical assistance         General transfer comment: Requires assistance to scoot forward.Max x 2 to stand from reclner twice with patient holding onto walker. Poor standing tolerance and could only tolerate approx less than 30 seconds. Patient's upper extremities not used effectively on walker and LLE unable to get fully underneath him as he we were unable to get him in to fully erect posture.    Balance Overall balance assessment: Needs assistance Sitting-balance support: Feet supported Sitting balance-Leahy Scale: Fair     Standing balance support: Bilateral upper extremity supported Standing balance-Leahy Scale: Poor Standing balance comment: reliant on external assistance                           ADL either performed or assessed with clinical judgement   ADL Overall ADL's : Needs assistance/impaired Eating/Feeding: Independent   Grooming: Set up;Sitting   Upper Body Bathing: Set up;Sitting   Lower Body Bathing: Sit to/from stand;+2 for physical assistance;Maximal assistance   Upper Body Dressing : Set up;Sitting  Lower Body Dressing: Total assistance;+2 for physical assistance;Sit to/from stand   Toilet Transfer: +2 for physical assistance;Total assistance;BSC;RW;Stand-pivot   Toileting- Clothing Manipulation and Hygiene: Total assistance;Sit to/from stand       Functional mobility during  ADLs: +2 for physical assistance;Rolling walker;Total assistance       Vision Patient Visual Report: No change from baseline       Perception     Praxis      Pertinent Vitals/Pain Pain Assessment: Faces Faces Pain Scale: Hurts little more Pain Location: L knee Pain Descriptors / Indicators: Grimacing Pain Intervention(s): Limited activity within patient's tolerance;Monitored during session;Repositioned     Hand Dominance Right   Extremity/Trunk Assessment Upper Extremity Assessment Upper Extremity Assessment: RUE deficits/detail;LUE deficits/detail RUE Deficits / Details: WFL ROM 5/5 strength except for grip 4/5 RUE Sensation: WNL RUE Coordination: WNL LUE Deficits / Details: WFL ROM 5/5 strength except for grip 4/5 LUE Sensation: WNL LUE Coordination: WNL   Lower Extremity Assessment Lower Extremity Assessment: Defer to PT evaluation   Cervical / Trunk Assessment Cervical / Trunk Assessment: Kyphotic   Communication Communication Communication: HOH   Cognition Arousal/Alertness: Awake/alert Behavior During Therapy: WFL for tasks assessed/performed Overall Cognitive Status: Within Functional Limits for tasks assessed                                     General Comments       Exercises     Shoulder Instructions      Home Living Family/patient expects to be discharged to:: Skilled nursing facility                                 Additional Comments: Patieht from [place].      Prior Functioning/Environment Level of Independence: Independent with assistive device(s)        Comments: Uses rollator for ambulation. They assist wtih bath twice a week otherwise independent with ADLs.        OT Problem List: Decreased strength;Decreased range of motion;Decreased activity tolerance;Impaired balance (sitting and/or standing);Cardiopulmonary status limiting activity;Decreased knowledge of precautions;Decreased knowledge of use  of DME or AE;Pain      OT Treatment/Interventions: Self-care/ADL training;Therapeutic exercise;DME and/or AE instruction;Therapeutic activities;Balance training;Patient/family education    OT Goals(Current goals can be found in the care plan section) Acute Rehab OT Goals Patient Stated Goal: To do more for myself OT Goal Formulation: With patient Time For Goal Achievement: 07/16/21 Potential to Achieve Goals: Good  OT Frequency: Min 2X/week   Barriers to D/C:            Co-evaluation              AM-PAC OT "6 Clicks" Daily Activity     Outcome Measure Help from another person eating meals?: None Help from another person taking care of personal grooming?: A Little Help from another person toileting, which includes using toliet, bedpan, or urinal?: Total Help from another person bathing (including washing, rinsing, drying)?: A Lot Help from another person to put on and taking off regular upper body clothing?: A Little Help from another person to put on and taking off regular lower body clothing?: Total 6 Click Score: 14   End of Session Equipment Utilized During Treatment: Gait belt;Rolling walker Nurse Communication: Mobility status  Activity Tolerance: Patient limited by pain;Patient limited by fatigue Patient left: in chair;with  call bell/phone within reach  OT Visit Diagnosis: Unsteadiness on feet (R26.81);Other abnormalities of gait and mobility (R26.89);Muscle weakness (generalized) (M62.81);Pain                Time: 1109-1130 OT Time Calculation (min): 21 min Charges:  OT General Charges $OT Visit: 1 Visit OT Evaluation $OT Eval Moderate Complexity: 1 Mod  Jaidyn Usery, OTR/L [place]  Office [phone] Pager: [place] 07/02/2021, 12:19 PM

## 2021-07-03 ENCOUNTER — Inpatient Hospital Stay (HOSPITAL_COMMUNITY): Payer: Medicare HMO

## 2021-07-03 DIAGNOSIS — A419 Sepsis, unspecified organism: Secondary | ICD-10-CM | POA: Diagnosis not present

## 2021-07-03 DIAGNOSIS — I480 Paroxysmal atrial fibrillation: Secondary | ICD-10-CM | POA: Diagnosis not present

## 2021-07-03 DIAGNOSIS — I10 Essential (primary) hypertension: Secondary | ICD-10-CM | POA: Diagnosis not present

## 2021-07-03 DIAGNOSIS — I5189 Other ill-defined heart diseases: Secondary | ICD-10-CM | POA: Diagnosis not present

## 2021-07-03 LAB — COMPREHENSIVE METABOLIC PANEL
ALT: 16 U/L (ref 0–44)
AST: 22 U/L (ref 15–41)
Albumin: 3.2 g/dL — ABNORMAL LOW (ref 3.5–5.0)
Alkaline Phosphatase: 73 U/L (ref 38–126)
Anion gap: 9 (ref 5–15)
BUN: 20 mg/dL (ref 8–23)
CO2: 22 mmol/L (ref 22–32)
Calcium: 9.1 mg/dL (ref 8.9–10.3)
Chloride: 109 mmol/L (ref 98–111)
Creatinine, Ser: 0.81 mg/dL (ref 0.61–1.24)
GFR, Estimated: >60 mL/min (ref >60)
Glucose, Bld: 178 mg/dL — ABNORMAL HIGH (ref 70–99)
Potassium: 4.2 mmol/L (ref 3.5–5.1)
Sodium: 140 mmol/L (ref 135–145)
Total Bilirubin: 1.2 mg/dL (ref 0.3–1.2)
Total Protein: 6.8 g/dL (ref 6.5–8.1)

## 2021-07-03 LAB — GLUCOSE, CAPILLARY
Glucose-Capillary: 156 mg/dL — ABNORMAL HIGH (ref 70–99)
Glucose-Capillary: 184 mg/dL — ABNORMAL HIGH (ref 70–99)
Glucose-Capillary: 90 mg/dL (ref 70–99)

## 2021-07-03 LAB — CBC
HCT: 34.3 % — ABNORMAL LOW (ref 39.0–52.0)
Hemoglobin: 10.9 g/dL — ABNORMAL LOW (ref 13.0–17.0)
MCH: 28.6 pg (ref 26.0–34.0)
MCHC: 31.8 g/dL (ref 30.0–36.0)
MCV: 90 fL (ref 80.0–100.0)
Platelets: 267 10*3/uL (ref 150–400)
RBC: 3.81 MIL/uL — ABNORMAL LOW (ref 4.22–5.81)
RDW: 15.3 % (ref 11.5–15.5)
WBC: 11.6 10*3/uL — ABNORMAL HIGH (ref 4.0–10.5)
nRBC: 0 % (ref 0.0–0.2)

## 2021-07-03 LAB — URINE CULTURE: Culture: NO GROWTH

## 2021-07-03 LAB — MAGNESIUM: Magnesium: 2.6 mg/dL — ABNORMAL HIGH (ref 1.7–2.4)

## 2021-07-03 MED ORDER — METHOCARBAMOL 500 MG PO TABS
500.0000 mg | ORAL_TABLET | Freq: Three times a day (TID) | ORAL | Status: DC | PRN
  Administered 2021-07-03 (×2): 500 mg via ORAL
  Filled 2021-07-03 (×2): qty 1

## 2021-07-03 MED ORDER — CEPHALEXIN 500 MG PO CAPS
500.0000 mg | ORAL_CAPSULE | Freq: Three times a day (TID) | ORAL | Status: DC
  Administered 2021-07-03: 500 mg via ORAL
  Filled 2021-07-03 (×2): qty 1

## 2021-07-03 MED ORDER — ALBUTEROL SULFATE (2.5 MG/3ML) 0.083% IN NEBU
2.5000 mg | INHALATION_SOLUTION | RESPIRATORY_TRACT | Status: DC | PRN
  Administered 2021-07-03: 2.5 mg via RESPIRATORY_TRACT
  Filled 2021-07-03: qty 3

## 2021-07-03 NOTE — Progress Notes (Addendum)
PROGRESS NOTE  JUDGE DUQUE GEX:[id number] DOB: 03-09-1919 DOA: 06/30/2021 PCP: Lajean Manes, MD   LOS: 3 days   Brief narrative:  Matthew Velez is a 87 y.o. male with medical history significant of transient atrial fibrillation, hypertension, diastolic dysfunction, type II DM, history of other non-hemorrhagic CVA, prostate cancer, RLE DVT and saddle pulmonary embolus was brought into the hospital from nursing home with progressive knee pain and edema not relieved with oxycodone.  No recent history of trauma but had failed few years back.  In the ED, showed large leukocyte esterase.  Leukocytosis was present with 84% neutrophils.  ESR was elevated.  CMP showed glucose of 216.  Chest x-ray showed mild left basilar opacity due to atelectasis.  Left knee x-ray showed nondisplaced fracture of the mid pole of the patella with knee joint effusion.   Assessment/Plan:  Principal Problem:   Sepsis due to undetermined organism [facility]) Active Problems:   Atrial fibrillation, transient   DM type 2 (diabetes mellitus, type 2) (HCC)   Benign hypertension   Diastolic dysfunction   Normocytic anemia   Left patella fracture   Pressure injury of skin  Possible sepsis.  Abnormal urinalysis.  On vancomycin and cefepime.  Will discontinue IV since MRSA was negative and there is no obvious positive cultures.  Transition to oral Keflex for few days to complete the course.   Chest x-ray shows  possible basilar atelectasis.  WBC at 11.6.  Temperature max of 98.1 F    Left patella fracture Continue knee immobilizer.  Seen by orthopedic.  Orthopedic recommended conservative treatment at this time.  Will need to follow-up with orthopedic as outpatient.  New weightbearing as tolerated.    Stage 2 Pressure injury of sacral skin POA. Continue wound care     Atrial fibrillation, paroxysmal CHA?DS?-VASc Score of 8.  Currently on apixaban.  Rate controlled at this time     DM type 2  On glipizide and metformin  as outpatient.  Continue sliding scale insulin while in the hospital..  Recent hemoglobin A1c of 7.5.  Closely monitor.       Benign hypertension Controlled on lisinopril.     Diastolic dysfunction Continue lisinopril.     Normocytic anemia Monitor CBC closely.  Latest hemoglobin of 10.9  Left buttocks stage II pressure ulceration.  Present on admission.  Continue wound care.  Debility, deconditioning.  Okay to ambulate with immobilizer, physical therapy has seen the patient and recommended skilled nursing facility placement.  Abdominal distention.  Gaseous.  X-ray KUB negative.  Has been eating.  DVT prophylaxis:  apixaban (ELIQUIS) tablet 5 mg   Code Status: DNR  Family Communication:  Unable to reach the patient's spouse listed on the computer  Status is: Inpatient  Remains inpatient appropriate because:Unsafe d/c plan, IV treatments appropriate due to intensity of illness or inability to take PO, and Inpatient level of care appropriate due to severity of illness  Dispo: The patient is from: Long-term care at [facility]              Anticipated d/c is to: SNF,               Patient currently is not medically stable to d/c.   Difficult to place patient No   Consultants: Orthopedics  Procedures: Knee brace  Anti-infectives:  Vancomycin and cefepime 10/7>10/10  Subjective: Today, patient was seen and examined at bedside.  Patient is hard of hearing.  Denies chest pain, nausea, vomiting, denies  much pain.  Objective: Vitals:   07/03/21 0340 07/03/21 1145  BP: (!) 150/82 134/78  Pulse: 80 93  Resp: 20 16  Temp: 97.6 F (36.4 C) 98 F (36.7 C)  SpO2: 96% 98%    Intake/Output Summary (Last 24 hours) at 07/03/2021 1326 Last data filed at 07/03/2021 0939 Gross per 24 hour  Intake 1440 ml  Output 900 ml  Net 540 ml    Filed Weights   06/30/21 1141 06/30/21 1723  Weight: 77.1 kg 82.9 kg   Body mass index is 30.42 kg/m.   Physical  Exam: General: Ill male, hard of hearing, obese, not in obvious distress HENT:   No scleral pallor or icterus noted. Oral mucosa is moist.  Chest:  Clear breath sounds.  Diminished breath sounds bilaterally. No crackles or wheezes.  CVS: S1 &S2 heard. No murmur.  Regular rate and rhythm. Abdomen: Soft, nontender, mildly distended abdomen, tympanic, bowel sounds are heard.   Extremities: No cyanosis, clubbing or edema.  Peripheral pulses are palpable.Left knee with mild erythema and tenderness on immobilizer. Psych: Alert, awake and oriented, normal mood CNS:  No cranial nerve deficits.   Skin: Warm and dry.  No rashes noted.   Data Review: I have personally reviewed the following laboratory data and studies,  CBC: Recent Labs  Lab 06/30/21 1205 07/01/21 0426 07/02/21 0414 07/03/21 0401  WBC 12.2* 10.3 10.9* 11.6*  NEUTROABS 10.3* 7.7  --   --   HGB 11.3* 10.5* 10.5* 10.9*  HCT 36.7* 32.2* 33.1* 34.3*  MCV 92.9 89.7 90.2 90.0  PLT 236 217 234 431    Basic Metabolic Panel: Recent Labs  Lab 06/30/21 1205 07/01/21 0426 07/02/21 0414 07/03/21 0401  NA 135 134* 140 140  K 3.8 4.0 4.0 4.2  CL 99 100 106 109  CO2 '26 24 23 22  ' GLUCOSE 216* 163* 153* 178*  BUN 20 25* 21 20  CREATININE 0.72 0.75 0.84 0.81  CALCIUM 8.7* 8.5* 8.8* 9.1  MG  --   --  2.4 2.6*    Liver Function Tests: Recent Labs  Lab 06/30/21 1205 07/03/21 0401  AST 25 22  ALT 15 16  ALKPHOS 81 73  BILITOT 1.0 1.2  PROT 7.1 6.8  ALBUMIN 3.5 3.2*    No results for input(s): LIPASE, AMYLASE in the last 168 hours. No results for input(s): AMMONIA in the last 168 hours. Cardiac Enzymes: No results for input(s): CKTOTAL, CKMB, CKMBINDEX, TROPONINI in the last 168 hours. BNP (last 3 results) No results for input(s): BNP in the last 8760 hours.  ProBNP (last 3 results) No results for input(s): PROBNP in the last 8760 hours.  CBG: Recent Labs  Lab 07/02/21 1143 07/02/21 1640 07/02/21 2103  07/03/21 0722 07/03/21 1140  GLUCAP 178* 120* 133* 156* 184*    Recent Results (from the past 240 hour(s))  Culture, blood (Routine x 2)     Status: None (Preliminary result)   Collection Time: 06/30/21 12:03 PM   Specimen: BLOOD  Result Value Ref Range Status   Specimen Description   Final    BLOOD RIGHT ANTECUBITAL Performed at [facility], [place] [address], [place], [place] [address]    Special Requests   Final    BOTTLES DRAWN AEROBIC AND ANAEROBIC Blood Culture results may not be optimal due to an excessive volume of blood received in culture bottles Performed at [place] [address] [place], [place] [address]    Culture  Final    NO GROWTH 3 DAYS Performed at [facility], [place] [address], [place], [place] [address]    Report Status PENDING  Incomplete  Culture, blood (Routine x 2)     Status: None (Preliminary result)   Collection Time: 06/30/21 12:09 PM   Specimen: BLOOD  Result Value Ref Range Status   Specimen Description   Final    BLOOD LEFT ANTECUBITAL Performed at [place] [address], [place], [place] [address]    Special Requests   Final    BOTTLES DRAWN AEROBIC AND ANAEROBIC Blood Culture results may not be optimal due to an excessive volume of blood received in culture bottles Performed at [place] [address], [place], [place] [address]    Culture   Final    NO GROWTH 3 DAYS Performed at [facility], [place] [address] [place], [place] [address]    Report Status PENDING  Incomplete  Resp Panel by RT-PCR (Flu A&B, Covid) Nasopharyngeal Swab     Status: None   Collection Time: 06/30/21  2:38 PM   Specimen: Nasopharyngeal Swab; Nasopharyngeal(NP) swabs in vial transport medium  Result Value Ref Range Status   SARS Coronavirus 2 by RT PCR NEGATIVE NEGATIVE Final    Comment: (NOTE) SARS-CoV-2 target nucleic acids are NOT DETECTED.  The SARS-CoV-2  RNA is generally detectable in upper respiratory specimens during the acute phase of infection. The lowest concentration of SARS-CoV-2 viral copies this assay can detect is 138 copies/mL. A negative result does not preclude SARS-Cov-2 infection and should not be used as the sole basis for treatment or other patient management decisions. A negative result may occur with  improper specimen collection/handling, submission of specimen other than nasopharyngeal swab, presence of viral mutation(s) within the areas targeted by this assay, and inadequate number of viral copies(<138 copies/mL). A negative result must be combined with clinical observations, patient history, and epidemiological information. The expected result is Negative.  Fact Sheet for Patients:  EntrepreneurPulse.com.au  Fact Sheet for Healthcare Providers:  IncredibleEmployment.be  This test is no t yet approved or cleared by the [place] FDA and  has been authorized for detection and/or diagnosis of SARS-CoV-2 by FDA under an Emergency Use Authorization (EUA). This EUA will remain  in effect (meaning this test can be used) for the duration of the COVID-19 declaration under Section 564(b)(1) of the Act, 21 U.S.C.section 360bbb-3(b)(1), unless the authorization is terminated  or revoked sooner.       Influenza A by PCR NEGATIVE NEGATIVE Final   Influenza B by PCR NEGATIVE NEGATIVE Final    Comment: (NOTE) The Xpert Xpress SARS-CoV-2/FLU/RSV plus assay is intended as an aid in the diagnosis of influenza from Nasopharyngeal swab specimens and should not be used as a sole basis for treatment. Nasal washings and aspirates are unacceptable for Xpert Xpress SARS-CoV-2/FLU/RSV testing.  Fact Sheet for Patients: EntrepreneurPulse.com.au  Fact Sheet for Healthcare Providers: IncredibleEmployment.be  This test is not yet approved or cleared by the  [place] FDA and has been authorized for detection and/or diagnosis of SARS-CoV-2 by FDA under an Emergency Use Authorization (EUA). This EUA will remain in effect (meaning this test can be used) for the duration of the COVID-19 declaration under Section 564(b)(1) of the Act, 21 U.S.C. section 360bbb-3(b)(1), unless the authorization is terminated or revoked.  Performed at [facility], [place] [address] [place],  [address]   Urine Culture  Status: None   Collection Time: 07/01/21  7:00 PM   Specimen: Urine, Clean Catch  Result Value Ref Range Status   Specimen Description   Final    URINE, CLEAN CATCH Performed at [facility], [place] [address], [place], [place] [address]    Special Requests   Final    NONE Performed at [facility], [place] [address] [place], [place] [address]    Culture   Final    NO GROWTH Performed at [facility], [place] [address] [place], [place] [address]    Report Status 07/03/2021 FINAL  Final      Studies: DG Abd 1 View  Result Date: 07/02/2021 CLINICAL DATA:  87 year old male with a history of abdominal distention EXAM: ABDOMEN - 1 VIEW COMPARISON:  CT 10/14/2017 FINDINGS: Gas present throughout small bowel and colon. No abnormal distension. No air-fluid levels on the supine images. No significant stool burden.  Formed stool in the rectum. No radiopaque foreign body. No unexpected soft tissue density or calcification. Prostate calcifications within the anatomic pelvis. Vascular calcifications of the upper abdomen. Degenerative changes of the spine. IMPRESSION: Nonobstructive bowel gas pattern Electronically Signed   By: Corrie Mckusick D.O.   On: 07/02/2021 13:50      Flora Lipps, MD  [facility] 07/03/2021  If 7PM-7AM, please contact night-coverage

## 2021-07-03 NOTE — Progress Notes (Addendum)
Patient started profusely spitting up after taking meds in the chair and then getting back to bed with 4 assist. Notified Olena Heckle. New order for STAT CXR to see if patient aspirated. Agricultural consultant in room. Resp notified. HR now in 130s to 140s. O2 still 100% on RA. Will continue to monitor.    Per RT wheezing on R upper lobe and O2 93% on RA. New order for albuterol PRN ordered and given by RT. Will continue to monitor.

## 2021-07-03 NOTE — TOC Progression Note (Signed)
Transition of Care [facility]) - Progression Note    Patient Details  Name: Matthew Velez MRN: [id number] Date of Birth: 02/19/1919  Transition of Care [facility]) CM/SW Contact  Ross Ludwig, [place] Phone Number: 07/03/2021, 12:53 PM  Clinical Narrative:     CSW was informed that patient is LTC at [facility].  Patient is normally able to ambulate to the bathroom with minimum assistance and walker.  Per PT and OT patient would benefit from some short term rehab, CSW to contact insurance company to see if patient can be approved for SNF placement.        Expected Discharge Plan and Services                                                 Social Determinants of Health (SDOH) Interventions    Readmission Risk Interventions Readmission Risk Prevention Plan 12/13/2018  Post Dischage Appt Complete  Medication Screening Complete  Transportation Screening Complete  Some recent data might be hidden

## 2021-07-03 NOTE — Care Management Important Message (Signed)
Important Message  Patient Details IM Letter given to the Patient. Name: Matthew Velez MRN: [id number] Date of Birth: 10-20-1918   Medicare Important Message Given:  Yes     Kerin Salen 07/03/2021, 12:14 PM

## 2021-07-03 NOTE — NC FL2 (Signed)
[place] LEVEL OF CARE SCREENING TOOL     IDENTIFICATION  Patient Name: Matthew Velez Birthdate: Jul 03, 1919 Sex: male Admission Date (Current Location): 06/30/2021  [facility] and [place] Number:  Herbalist and Address:  [facility],  [address] [place], [place]      Provider Number: [id number]  Attending Physician Name and Address:  Flora Lipps, MD  Relative Name and Phone Number:  Shayne, Diguglielmo [phone]    Current Level of Care: Hospital Recommended Level of Care: [place] Prior Approval Number:    Date Approved/Denied:   PASRR Number: [id number]  Discharge Plan: SNF    Current Diagnoses: Patient Active Problem List   Diagnosis Date Noted   Sepsis due to undetermined organism ([place]) 06/30/2021   Normocytic anemia 06/30/2021   Infarction of right basal ganglia ([place]) 06/30/2021   Left patella fracture 06/30/2021   Pressure injury of skin 06/30/2021   Bilateral leg weakness 12/13/2018   UTI (urinary tract infection) 12/12/2018   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   Atrial fibrillation, transient 02/19/2013   Right leg DVT ([place]) 02/19/2013   DM type 2 (diabetes mellitus, type 2) ([place]) 02/19/2013   Prostate cancer ([place]) 02/19/2013   Benign hypertension 67/08/4579   Diastolic dysfunction 99/83/3825    Orientation RESPIRATION BLADDER Height & Weight     Self, Time, Situation, Place  Normal Continent Weight: 182 lb 12.8 oz (82.9 kg) Height:  5\' 5"  (165.1 cm)  BEHAVIORAL SYMPTOMS/MOOD NEUROLOGICAL BOWEL NUTRITION STATUS      Incontinent Diet (Carb modified)  AMBULATORY STATUS COMMUNICATION OF NEEDS Skin   Limited Assist Verbally PU Stage and Appropriate Care   PU Stage 2 Dressing:  (PRN dressing change)                   Personal Care Assistance Level of Assistance  Bathing, Feeding, Dressing Bathing Assistance: Limited assistance Feeding assistance: Independent Dressing  Assistance: Limited assistance     Functional Limitations Info  Sight, Speech, Hearing Sight Info: Adequate Hearing Info: Adequate Speech Info: Adequate    SPECIAL CARE FACTORS FREQUENCY  PT (By licensed PT), OT (By licensed OT)     PT Frequency: Minimum 5x a week OT Frequency: Minimum 5x a week            Contractures Contractures Info: Not present    Additional Factors Info  Code Status, Allergies, Insulin Sliding Scale Code Status Info: DNR Allergies Info: Chocolate   Fish Allergy   Mixed Feathers   Other   Shrimp Extract Allergy Skin Test   Iodine   Insulin Sliding Scale Info: insulin aspart (novoLOG) injection 0-9 Units 3x a day with meals       Current Medications (07/03/2021):  This is the current hospital active medication list Current Facility-Administered Medications  Medication Dose Route Frequency Provider Last Rate Last Admin   acetaminophen (TYLENOL) tablet 650 mg  650 mg Oral Q6H PRN Reubin Milan, MD   650 mg at 07/03/21 0539   Or   acetaminophen (TYLENOL) suppository 650 mg  650 mg Rectal Q6H PRN Reubin Milan, MD       alum & mag hydroxide-simeth (MAALOX/MYLANTA) 200-200-20 MG/5ML suspension 30 mL  30 mL Oral Q4H PRN Reubin Milan, MD   30 mL at 06/30/21 2314   apixaban (ELIQUIS) tablet 5 mg  5 mg Oral BID Reubin Milan, MD   5 mg at 07/03/21 0935   cephALEXin (KEFLEX) capsule  500 mg  500 mg Oral Q8H Pokhrel, Laxman, MD       cholecalciferol (VITAMIN D3) tablet 50 mcg  50 mcg Oral Daily Reubin Milan, MD   50 mcg at 07/03/21 5102   diclofenac Sodium (VOLTAREN) 1 % topical gel 4 g  4 g Topical TID PRN Reubin Milan, MD       docusate sodium (COLACE) capsule 100 mg  100 mg Oral BID Pokhrel, Laxman, MD   100 mg at 07/03/21 0935   finasteride (PROSCAR) tablet 5 mg  5 mg Oral QHS Reubin Milan, MD   5 mg at 07/02/21 2139   fluticasone (FLONASE) 50 MCG/ACT nasal spray 1 spray  1 spray Each Nare Daily Reubin Milan, MD   1 spray at 07/03/21 0935   furosemide (LASIX) tablet 40 mg  40 mg Oral Daily Reubin Milan, MD   40 mg at 07/03/21 0935   insulin aspart (novoLOG) injection 0-9 Units  0-9 Units Subcutaneous TID WC Reubin Milan, MD   2 Units at 07/03/21 1227   latanoprost (XALATAN) 0.005 % ophthalmic solution 1 drop  1 drop Both Eyes QHS Reubin Milan, MD   1 drop at 07/02/21 2140   lisinopril (ZESTRIL) tablet 10 mg  10 mg Oral Daily Reubin Milan, MD   10 mg at 07/03/21 0934   loratadine (CLARITIN) tablet 10 mg  10 mg Oral Daily Reubin Milan, MD   10 mg at 07/03/21 5852   menthol-cetylpyridinium (CEPACOL) lozenge 3 mg  1 lozenge Oral Q2H PRN Reubin Milan, MD       methocarbamol (ROBAXIN) tablet 500 mg  500 mg Oral Q8H PRN Pokhrel, Laxman, MD   500 mg at 07/03/21 1227   ondansetron (ZOFRAN) tablet 4 mg  4 mg Oral Q6H PRN Reubin Milan, MD       Or   ondansetron [facility]) injection 4 mg  4 mg Intravenous Q6H PRN Reubin Milan, MD       oxyCODONE (Oxy IR/ROXICODONE) immediate release tablet 5 mg  5 mg Oral Q4H PRN Reubin Milan, MD   5 mg at 07/03/21 0935   pantoprazole (PROTONIX) EC tablet 40 mg  40 mg Oral BID Reubin Milan, MD   40 mg at 07/03/21 0935   polyethylene glycol (MIRALAX / GLYCOLAX) packet 17 g  17 g Oral Daily Reubin Milan, MD   17 g at 07/03/21 0932   polyvinyl alcohol (LIQUIFILM TEARS) 1.4 % ophthalmic solution 1 drop  1 drop Both Eyes TID Reubin Milan, MD   1 drop at 07/03/21 0935   simethicone (MYLICON) 40 DP/8.2UM suspension 40 mg  40 mg Oral Q6H PRN Pokhrel, Corrie Mckusick, MD         Discharge Medications: Please see discharge summary for a list of discharge medications.  Relevant Imaging Results:  Relevant Lab Results:   Additional Information SSN [id number]  Ross Ludwig, LCSW

## 2021-07-03 NOTE — Discharge Instructions (Signed)
Weight bearing as tolerated on left leg. Keep knee immobilizer in place at all times until follow up in our office.

## 2021-07-03 NOTE — Evaluation (Signed)
Physical Therapy Evaluation Patient Details Name: Matthew Velez MRN: [id number] DOB: 10/05/1918 Today's Date: 07/03/2021  History of Present Illness  Matthew Velez is a 87 y.o. male with medical history significant of transient atrial fibrillation, hypertension, diastolic dysfunction, type II DM, history of other non-hemorrhagic CVA, prostate cancer, RLE DVT and saddle pulmonary embolus was brought into the hospital from nursing home with progressive knee pain and edema not relieved with oxycodone. Left knee x-ray showed nondisplaced fracture of the mid pole of the patella with knee joint effusion.  Clinical Impression  Pt admitted with above diagnosis.  Pt currently with functional limitations due to the deficits listed below (see PT Problem List). Pt will benefit from skilled PT to increase their independence and safety with mobility to allow discharge to the venue listed below.  Pt in recliner on arrival.  Pt reports pain and spasms today and unable to tolerate standing.  Pt typically ambulatory with walker at [facility] facility.  Pt may benefit from SNF for rehab upon d/c.        Recommendations for follow up therapy are one component of a multi-disciplinary discharge planning process, led by the attending physician.  Recommendations may be updated based on patient status, additional functional criteria and insurance authorization.  Follow Up Recommendations SNF    Equipment Recommendations  None recommended by PT    Recommendations for Other Services       Precautions / Restrictions Precautions Precautions: Fall Required Braces or Orthoses: Knee Immobilizer - Left Knee Immobilizer - Left: On at all times Restrictions Weight Bearing Restrictions: Yes LLE Weight Bearing: Weight bearing as tolerated      Mobility  Bed Mobility               General bed mobility comments: pt in recliner on arrival    Transfers Overall transfer level: Needs assistance Equipment used:  Rolling walker (2 wheeled) Transfers: Sit to/from Stand Sit to Stand: Total assist;+2 physical assistance         General transfer comment: pt slide down in recliner with left LE dangling over recliner, pt assisted to repositioning, pt unable to scoot hips back so brought pt into sitting position with supported L LE, pt unable to stand and declined attempting with +2 assist, RN called into room to assist with repositioning pt in recliner; pt with poor ability to tolerate trunk/hip flexion in order to self assist more  Ambulation/Gait                Stairs            Wheelchair Mobility    Modified Rankin (Stroke Patients Only)       Balance                                             Pertinent Vitals/Pain Pain Assessment: Faces Faces Pain Scale: Hurts whole lot Pain Location: L leg and back Pain Descriptors / Indicators: Grimacing;Guarding Pain Intervention(s): Monitored during session;Repositioned (RN reports pt received pain, pt also having spasms so requested RN check on muscle relaxer)    Home Living Family/patient expects to be discharged to:: Skilled nursing facility                 Additional Comments: Patient from [facility].    Prior Function Level of Independence: Needs assistance   Gait / Transfers Assistance Needed:  Per CSW who spoke to [facility]: "able to ambulate to bathroom with walker and minimal assist"     Comments: Per OT note: "Uses rollator for ambulation. They assist wtih bath twice a week otherwise independent with ADLs."     Hand Dominance   Dominant Hand: Right    Extremity/Trunk Assessment        Lower Extremity Assessment Lower Extremity Assessment: Generalized weakness;LLE deficits/detail LLE Deficits / Details: assisted with repositioning KI to appropriate position and maintained LLE: Unable to fully assess due to immobilization;Unable to fully assess due to pain    Cervical / Trunk  Assessment Cervical / Trunk Assessment: Kyphotic  Communication   Communication: HOH  Cognition Arousal/Alertness: Awake/alert Behavior During Therapy: WFL for tasks assessed/performed Overall Cognitive Status: Within Functional Limits for tasks assessed                                 General Comments: pt appears to have intact cognition however very HOH      General Comments      Exercises     Assessment/Plan    PT Assessment Patient needs continued PT services  PT Problem List Decreased range of motion;Decreased strength;Decreased mobility;Decreased activity tolerance;Decreased balance;Decreased knowledge of use of DME;Decreased knowledge of precautions;Pain       PT Treatment Interventions DME instruction;Gait training;Balance training;Therapeutic exercise;Functional mobility training;Therapeutic activities;Patient/family education;Wheelchair mobility training    PT Goals (Current goals can be found in the Care Plan section)  Acute Rehab PT Goals PT Goal Formulation: With patient Time For Goal Achievement: 07/17/21 Potential to Achieve Goals: Good    Frequency Min 2X/week   Barriers to discharge        Co-evaluation               AM-PAC PT "6 Clicks" Mobility  Outcome Measure Help needed turning from your back to your side while in a flat bed without using bedrails?: Total Help needed moving from lying on your back to sitting on the side of a flat bed without using bedrails?: Total Help needed moving to and from a bed to a chair (including a wheelchair)?: Total Help needed standing up from a chair using your arms (e.g., wheelchair or bedside chair)?: Total Help needed to walk in hospital room?: Total   6 Click Score: 5    End of Session Equipment Utilized During Treatment: Gait belt;Left knee immobilizer Activity Tolerance: Patient limited by fatigue;Patient limited by pain Patient left: in chair;with call bell/phone within reach;with  nursing/sitter in room Nurse Communication: Mobility status;Need for lift equipment PT Visit Diagnosis: Other abnormalities of gait and mobility (R26.89);Pain Pain - Right/Left: Left Pain - part of body: Knee    Time: 2440-1027 PT Time Calculation (min) (ACUTE ONLY): 18 min   Charges:   PT Evaluation $PT Eval Low Complexity: 1 Low        Kati PT, DPT Acute Rehabilitation Services Pager: [phone] Office: [place] 07/03/2021, 12:29 PM

## 2021-07-03 NOTE — Plan of Care (Signed)
  Problem: Clinical Measurements: Goal: Will remain free from infection Outcome: Adequate for Discharge   

## 2021-07-04 DIAGNOSIS — I480 Paroxysmal atrial fibrillation: Secondary | ICD-10-CM | POA: Diagnosis not present

## 2021-07-04 DIAGNOSIS — A419 Sepsis, unspecified organism: Principal | ICD-10-CM

## 2021-07-04 DIAGNOSIS — I5189 Other ill-defined heart diseases: Secondary | ICD-10-CM | POA: Diagnosis not present

## 2021-07-04 DIAGNOSIS — I10 Essential (primary) hypertension: Secondary | ICD-10-CM | POA: Diagnosis not present

## 2021-07-04 LAB — GLUCOSE, CAPILLARY
Glucose-Capillary: 136 mg/dL — ABNORMAL HIGH (ref 70–99)
Glucose-Capillary: 142 mg/dL — ABNORMAL HIGH (ref 70–99)
Glucose-Capillary: 144 mg/dL — ABNORMAL HIGH (ref 70–99)

## 2021-07-04 MED ORDER — CEPHALEXIN 500 MG PO CAPS: 500.0000 mg | ORAL_CAPSULE | Freq: Three times a day (TID) | ORAL | 0 refills | Status: AC

## 2021-07-04 MED ORDER — MORPHINE SULFATE 20 MG/5ML PO SOLN: 5.0000 mg | ORAL | 0 refills | Status: AC | PRN

## 2021-07-04 MED ORDER — METHOCARBAMOL 500 MG PO TABS: 500.0000 mg | ORAL_TABLET | Freq: Three times a day (TID) | ORAL | Status: AC | PRN

## 2021-07-04 MED ORDER — OXYCODONE HCL 5 MG PO TABS: 5.0000 mg | ORAL_TABLET | ORAL | 0 refills | Status: AC | PRN

## 2021-07-04 NOTE — Consult Note (Signed)
  Matthew Velez is is 87 year old Caucasian male with medical history significant of transient atrial fibrillation, hypertension, diastolic dysfunction, type 2 diabetes, history of other nonhemorrhagic CVA, prostate cancer, right lower extremity DVT, and saddle pulmonary embolus who was brought into the hospital from nursing home with progressive knee pain and edema not relieved of oxycodone.  Psychiatric consult has been placed for patient expressing wanting to die, depressed.  There is also a consult placed for palliative care to establish goals of care.  Patient is seen and assessed by this nurse practitioner, chart reviewed, and consulted with Dr. Lovette Cliche.  At present patient is alert and oriented x2 (unclear of his baseline), hard of hearing, calm and cooperative.  Patient does advise that he is hard of hearing and both ears, and states his hearing aids are charging.  He was able to communicate briefly and expressed that he is uncomfortable.  Further clarification is sought on the term " uncomfortable", in which he replies I just want to be made comfortable.  He was provided assistance with readjusting in the bed, and adjusting the pillows under his neck, and raising the head of his bed in which he reports provided some relief.  He does report that he is in pain, and is tired.  He requests assistance with end of life decisions, and we briefly reviewed some end-of-life discussions however this will be deferred to palliative care.  He is advised that we do not do medication assisted suicide in the [place], however we can meet and agree on his long-term goals.  Patient does not appear to be delirious, psychotic, manic.  Furthermore he is not endorsing any acute or current suicidal intent, suicidal ideations, suicidal thoughts.  He also denies any homicidal ideations and or auditory or visual hallucinations.  Writer has reached out to attending Dr. Louanne Belton, regarding psychiatric consult and  need to defer care to palliative medicine team to establish goals, long-term care, and prognosis.  Patient is also currently being treated for sepsis, and leukocytosis of the urine.  Although his nitrites are negative, recommend treating as any small infection can disrupt our geriatric patients, and altered their mood.  We will also defer from starting any psychotropic medications at this time, unless palliative medicine request additional information.   -Will defer treatment to palliative medicine team. -Recommend treating any electrolyte abnormality, infection, and appropriate management of pain. -If patient does or appears to be psychotic or developing delirium, recommend one-to-one safety sitter.  However it is not obvious or apparent that the symptoms are developing at this time.  Psychiatry will sign off at this time.

## 2021-07-04 NOTE — Progress Notes (Signed)
PROGRESS NOTE  [name] HYW:[id number] DOB: [birth year] DOA: 06/30/2021 PCP: Lajean Manes, MD   LOS: 4 days   Brief narrative:  Matthew Velez is a 87 y.o. male with medical history significant of transient atrial fibrillation, hypertension, diastolic dysfunction, type II DM, history of other non-hemorrhagic CVA, prostate cancer, RLE DVT and saddle pulmonary embolus was brought into the hospital from nursing home with progressive knee pain and edema not relieved with oxycodone.  No recent history of trauma but had failed few years back.  In the ED, showed large leukocyte esterase.  Leukocytosis was present with 84% neutrophils.  ESR was elevated.  CMP showed glucose of 216.  Chest x-ray showed mild left basilar opacity due to atelectasis.  Left knee x-ray showed nondisplaced fracture of the mid pole of the patella with knee joint effusion.  For further evaluation and treatment.   Assessment/Plan:  Principal Problem:   Sepsis due to undetermined organism [facility]) Active Problems:   Atrial fibrillation, transient   DM type 2 (diabetes mellitus, type 2) (HCC)   Benign hypertension   Diastolic dysfunction   Normocytic anemia   Left patella fracture   Pressure injury of skin  Depressed mood expressing that he does not wish to live any more.  We will get palliative care consultation.  Psychiatry evaluation.  Might need one-to-one sitter.  Spoke at the progression rounds.  Possible sepsis.  Abnormal urinalysis.   Urine culture no growth so far.  Transition to oral Keflex to complete the course.   Chest x-ray shows  possible basilar atelectasis.  WBC at 11.6.      Left patella fracture Continue knee immobilizer.  Seen by orthopedics.  Orthopedic recommended conservative treatment at this time.  Will need to follow-up with orthopedic as outpatient.  weightbearing as tolerated.    Stage 2 Pressure injury of sacral skin POA. Continue wound care     Atrial fibrillation,  paroxysmal CHA?DS?-VASc Score of 8.  Currently on apixaban.  Rate controlled at this time     DM type 2  On glipizide and metformin as outpatient.  Continue sliding scale insulin while in the hospital..  Recent hemoglobin A1c of 7.5.  Closely monitor.  Latest POC glucose of 142     Benign hypertension Controlled on lisinopril.     Diastolic dysfunction Continue lisinopril.     Normocytic anemia Monitor CBC closely.  Latest hemoglobin of 10.9  Left buttocks stage II pressure ulceration.  Present on admission.  Continue wound care.  Debility, deconditioning.  Okay to ambulate with immobilizer, physical therapy has seen the patient and recommended skilled nursing facility placement.  Abdominal distention.  Gaseous.  X-ray KUB negative.  Has been eating.  DVT prophylaxis:  apixaban (ELIQUIS) tablet 5 mg   Code Status: DNR  Family Communication:  Unable to reach the patient's spouse listed on the computer.  Status is: Inpatient  Remains inpatient appropriate because:Unsafe d/c plan, IV treatments appropriate due to intensity of illness or inability to take PO, and Inpatient level of care appropriate due to severity of illness  Dispo: The patient is from: Long-term care at [facility]              Anticipated d/c is to: SNF,               Patient currently is not medically stable to d/c.   Difficult to place patient No   Consultants: Orthopedics Palliative care Orthopedics  Procedures: Knee brace  Anti-infectives:  Vancomycin and cefepime 10/7>10/10 Keflex 10/10>  Subjective: Today, patient was seen and examined at bedside.  Patient states that he does not want to continue to leave pain wishes to die.  Complains of mild cough.  Denies obvious pain.  Objective: Vitals:   07/04/21 0648 07/04/21 0959  BP: (!) 136/92 131/79  Pulse: (!) 102 95  Resp: 18 20  Temp: 98 F (36.7 C) 97.6 F (36.4 C)  SpO2: 92% 98%    Intake/Output Summary (Last 24 hours)  at 07/04/2021 1154 Last data filed at 07/03/2021 1700 Gross per 24 hour  Intake 480 ml  Output 700 ml  Net -220 ml    Filed Weights   06/30/21 1141 06/30/21 1723  Weight: 77.1 kg 82.9 kg   Body mass index is 30.42 kg/m.   Physical Exam: General: Appears depressed tearful and anxious, elderly male, hard of hearing, obese, HENT:   No scleral pallor or icterus noted. Oral mucosa is moist.  Chest:  Diminished breath sounds bilaterally. No crackles or wheezes.  CVS: S1 &S2 heard. No murmur.  Regular rate and rhythm. Abdomen: Soft, nontender, mildly distended abdomen, tympanic, bowel sounds are heard.   Extremities: No cyanosis, clubbing or edema.  Peripheral pulses are palpable. Left knee with mild erythema and tenderness on immobilizer. Psych: Alert, awake and communicative, anxious and depressed with tearful mood CNS:  No cranial nerve deficits.  Moves extremities. Skin: Warm and dry.  No rashes noted.   Data Review: I have personally reviewed the following laboratory data and studies,  CBC: Recent Labs  Lab 06/30/21 1205 07/01/21 0426 07/02/21 0414 07/03/21 0401  WBC 12.2* 10.3 10.9* 11.6*  NEUTROABS 10.3* 7.7  --   --   HGB 11.3* 10.5* 10.5* 10.9*  HCT 36.7* 32.2* 33.1* 34.3*  MCV 92.9 89.7 90.2 90.0  PLT 236 217 234 063    Basic Metabolic Panel: Recent Labs  Lab 06/30/21 1205 07/01/21 0426 07/02/21 0414 07/03/21 0401  NA 135 134* 140 140  K 3.8 4.0 4.0 4.2  CL 99 100 106 109  CO2 _0 GLUCOSE 216* 163* 153* 178*  BUN 20 25* 21 20  CREATININE 0.72 0.75 0.84 0.81  CALCIUM 8.7* 8.5* 8.8* 9.1  MG  --   --  2.4 2.6*    Liver Function Tests: Recent Labs  Lab 06/30/21 1205 07/03/21 0401  AST 25 22  ALT 15 16  ALKPHOS 81 73  BILITOT 1.0 1.2  PROT 7.1 6.8  ALBUMIN 3.5 3.2*    No results for input(s): LIPASE, AMYLASE in the last 168 hours. No results for input(s): AMMONIA in the last 168 hours. Cardiac Enzymes: No results for input(s):  CKTOTAL, CKMB, CKMBINDEX, TROPONINI in the last 168 hours. BNP (last 3 results) No results for input(s): BNP in the last 8760 hours.  ProBNP (last 3 results) No results for input(s): PROBNP in the last 8760 hours.  CBG: Recent Labs  Lab 07/03/21 0722 07/03/21 1140 07/03/21 1630 07/04/21 0745 07/04/21 1135  GLUCAP 156* 184* 90 136* 142*    Recent Results (from the past 240 hour(s))  Culture, blood (Routine x 2)     Status: None (Preliminary result)   Collection Time: 06/30/21 12:03 PM   Specimen: BLOOD  Result Value Ref Range Status   Specimen Description   Final    BLOOD RIGHT ANTECUBITAL Performed at [facility] [address], [place], [place] [address]    Special Requests   Final  BOTTLES DRAWN AEROBIC AND ANAEROBIC Blood Culture results may not be optimal due to an excessive volume of blood received in culture bottles Performed at [facility], [place] [address], [place], [place] [address]    Culture   Final    NO GROWTH 4 DAYS Performed at [facility], [place] [address], [place], [place] [address]    Report Status PENDING  Incomplete  Culture, blood (Routine x 2)     Status: None (Preliminary result)   Collection Time: 06/30/21 12:09 PM   Specimen: BLOOD  Result Value Ref Range Status   Specimen Description   Final    BLOOD LEFT ANTECUBITAL Performed at [facility] [address], [place], [place] [address]    Special Requests   Final    BOTTLES DRAWN AEROBIC AND ANAEROBIC Blood Culture results may not be optimal due to an excessive volume of blood received in culture bottles Performed at [facility] [address] [place], [place] [address]    Culture   Final    NO GROWTH 4 DAYS Performed at [facility], [place] [address] [place], [place] [address]    Report Status PENDING  Incomplete  Resp Panel by RT-PCR (Flu A&B, Covid) Nasopharyngeal Swab     Status: None    Collection Time: 06/30/21  2:38 PM   Specimen: Nasopharyngeal Swab; Nasopharyngeal(NP) swabs in vial transport medium  Result Value Ref Range Status   SARS Coronavirus 2 by RT PCR NEGATIVE NEGATIVE Final    Comment: (NOTE) SARS-CoV-2 target nucleic acids are NOT DETECTED.  The SARS-CoV-2 RNA is generally detectable in upper respiratory specimens during the acute phase of infection. The lowest concentration of SARS-CoV-2 viral copies this assay can detect is 138 copies/mL. A negative result does not preclude SARS-Cov-2 infection and should not be used as the sole basis for treatment or other patient management decisions. A negative result may occur with  improper specimen collection/handling, submission of specimen other than nasopharyngeal swab, presence of viral mutation(s) within the areas targeted by this assay, and inadequate number of viral copies(<138 copies/mL). A negative result must be combined with clinical observations, patient history, and epidemiological information. The expected result is Negative.  Fact Sheet for Patients:  EntrepreneurPulse.com.au  Fact Sheet for Healthcare Providers:  IncredibleEmployment.be  This test is no t yet approved or cleared by the [place] FDA and  has been authorized for detection and/or diagnosis of SARS-CoV-2 by FDA under an Emergency Use Authorization (EUA). This EUA will remain  in effect (meaning this test can be used) for the duration of the COVID-19 declaration under Section 564(b)(1) of the Act, 21 U.S.C.section 360bbb-3(b)(1), unless the authorization is terminated  or revoked sooner.       Influenza A by PCR NEGATIVE NEGATIVE Final   Influenza B by PCR NEGATIVE NEGATIVE Final    Comment: (NOTE) The Xpert Xpress SARS-CoV-2/FLU/RSV plus assay is intended as an aid in the diagnosis of influenza from Nasopharyngeal swab specimens and should not be used as a sole basis for treatment.  Nasal washings and aspirates are unacceptable for Xpert Xpress SARS-CoV-2/FLU/RSV testing.  Fact Sheet for Patients: EntrepreneurPulse.com.au  Fact Sheet for Healthcare Providers: IncredibleEmployment.be  This test is not yet approved or cleared by the [place] FDA and has been authorized for detection and/or diagnosis of SARS-CoV-2 by FDA under an Emergency Use Authorization (EUA). This EUA will remain in effect (meaning this test can be used) for the  duration of the COVID-19 declaration under Section 564(b)(1) of the Act, 21 U.S.C. section 360bbb-3(b)(1), unless the authorization is terminated or revoked.  Performed at [facility], [place] [address] [place], [place] [address]   Urine Culture     Status: None   Collection Time: 07/01/21  7:00 PM   Specimen: Urine, Clean Catch  Result Value Ref Range Status   Specimen Description   Final    URINE, CLEAN CATCH Performed at [facility], [place] [address] [place], [place] [address]    Special Requests   Final    NONE Performed at [facility], [place] [address] [place], [place] [address]    Culture   Final    NO GROWTH Performed at [facility], [place] [address], [place], [place] [address]    Report Status 07/03/2021 FINAL  Final      Studies: DG Abd 1 View  Result Date: 07/02/2021 CLINICAL DATA:  87 year old male with a history of abdominal distention EXAM: ABDOMEN - 1 VIEW COMPARISON:  CT 10/14/2017 FINDINGS: Gas present throughout small bowel and colon. No abnormal distension. No air-fluid levels on the supine images. No significant stool burden.  Formed stool in the rectum. No radiopaque foreign body. No unexpected soft tissue density or calcification. Prostate calcifications within the anatomic pelvis. Vascular calcifications of the upper abdomen. Degenerative changes of the spine. IMPRESSION: Nonobstructive bowel gas  pattern Electronically Signed   By: Corrie Mckusick D.O.   On: 07/02/2021 13:50   DG CHEST PORT 1 VIEW  Result Date: 07/03/2021 CLINICAL DATA:  Aspiration EXAM: PORTABLE CHEST 1 VIEW COMPARISON:  Chest x-ray 06/30/2021, CT chest 02/19/2013 FINDINGS: The heart and mediastinal contours are unchanged. Aortic calcification. Low lung volumes. Bibasilar patchy airspace opacities. No pulmonary edema. Likely bilateral pleural effusions, right greater than left. No pneumothorax. No acute osseous abnormality. IMPRESSION: Bibasilar patchy airspace opacities with likely bilateral pleural effusions, right greater than left. Electronically Signed   By: Iven Finn M.D.   On: 07/03/2021 23:36      Flora Lipps, MD  [facility] 07/04/2021  If 7PM-7AM, please contact night-coverage

## 2021-07-04 NOTE — Progress Notes (Addendum)
   07/03/21 2302  Assess: MEWS Score  Pulse Rate (!) 130  Level of Consciousness Alert  SpO2 93 %  O2 Device Room Air  Assess: MEWS Score  MEWS Temp 0  MEWS Systolic 0  MEWS Pulse 3  MEWS RR 0  MEWS LOC 0  MEWS Score 3  MEWS Score Color Yellow  Assess: if the MEWS score is Yellow or Red  Were vital signs taken at a resting state? No  Focused Assessment Change from prior assessment (see assessment flowsheet)  Does the patient meet 2 or more of the SIRS criteria? Yes  Does the patient have a confirmed or suspected source of infection? Yes  Provider and Rapid Response Notified? Yes (notified provider but not RRT)  MEWS guidelines implemented *See Row Information* Yes  Treat  MEWS Interventions Administered prn meds/treatments  Assess: SIRS CRITERIA  SIRS Temperature  0  SIRS Pulse 1  SIRS Respirations  0  SIRS WBC 0  SIRS Score Sum  1   Albuterol ordered and given and STAT CXR ordered. Charge RN [place] notified and Annalissa RT notified and assessed patient. Annalissa also administered albuterol.

## 2021-07-04 NOTE — Discharge Summary (Addendum)
Physician Discharge Summary  Matthew Velez STM:[id number] DOB: March 28, 1919 DOA: 06/30/2021  PCP: Lajean Manes, MD  Admit date: 06/30/2021 Discharge date: 07/04/2021  Admitted From:  SNF  Discharge disposition: SNF  Recommendations for Outpatient Follow-Up:   Follow up with your primary care provider at the skilled nursing facility in 3 to 5 days Check CBC, BMP, magnesium in the next visit Please consider palliative care at the skilled nursing facility. Follow-up with orthopedics Dr. Mardelle Matte for patellar fracture follow-up in 2 weeks.  Continue knee  immobilizer for ambulation.  Discharge Diagnosis:   Principal Problem:   Sepsis due to undetermined organism [facility]) Active Problems:   Atrial fibrillation, transient   DM type 2 (diabetes mellitus, type 2) (HCC)   Benign hypertension   Diastolic dysfunction   Normocytic anemia   Left patella fracture   Pressure injury of skin UTI  Discharge Condition: Improved.  Diet recommendation: Low sodium, heart healthy.  Carbohydrate-modified.    Wound care: None.  Code status: DNR   History of Present Illness:   Matthew Velez is a 87 y.o. male with medical history significant of transient atrial fibrillation, hypertension, diastolic dysfunction, type II DM, history of other non-hemorrhagic CVA, prostate cancer, RLE DVT and saddle pulmonary embolus was brought into the hospital from nursing home with progressive knee pain and edema not relieved with oxycodone.  No recent history of trauma but had failed few years back.  In the ED, showed large leukocyte esterase.  Leukocytosis was present with 84% neutrophils.  ESR was elevated.  CMP showed glucose of 216.  Chest x-ray showed mild left basilar opacity due to atelectasis.  Left knee x-ray showed nondisplaced fracture of the mid pole of the patella with knee joint effusion.  Patient was then admitted to hospital for further evaluation and treatment.  Hospital Course:   Following  conditions were addressed during hospitalization as listed below,  Depressed mood expressing that he does not wish to live any more.   Seen by psychiatry.  Thought to be secondary to pain/discomfort acute distress and ongoing suffering..  No suicidal ideation.  Psychiatry recommended palliative care consultation for goals of care.  Focus on symptom management.  Patient is on morphine/ oxycodone at the facility.  We will continue on discharge.  He would benefit from palliative care initiation at the facility.  Sepsis likely secondary to UTI.  Abnormal urinalysis.   Urine culture no growth so far.  Transition to oral Keflex to complete the course on discharge.   Chest x-ray shows  possible basilar atelectasis.  WBC at 11.6.      Left patella fracture Continue knee immobilizer on discharge..  Seen by orthopedics.  Orthopedic recommended conservative treatment at this time.  Will need to follow-up with orthopedic as outpatient.  weightbearing as tolerated with the knee brace.     Stage 2 Pressure injury of sacral skin POA. Continue wound care on discharge     Atrial fibrillation, paroxysmal CHA?DS?-VASc Score of 8.  Currently on apixaban.  Rate controlled at this time     DM type 2  On glipizide and metformin as outpatient.  Continue diabetic diet and oral hypoglycemic agents on discharge.    Benign hypertension Controlled on lisinopril.     Diastolic dysfunction Continue lisinopril.     Normocytic anemia Monitor CBC closely.  Latest hemoglobin of 10.9   Left buttocks stage II pressure ulceration.  Present on admission.  Continue wound care after discharge..   Debility, deconditioning.  Okay to ambulate with immobilizer, physical therapy has seen the patient and recommended skilled nursing facility placement.   Abdominal distention.  Gaseous.  X-ray KUB negative.  Has been eating.  Disposition.  At this time, patient is stable for disposition to skilled nursing facility.  Patient would  benefit from palliative care follow-up as outpatient.  Medical Consultants:   Orthopedics Psychiatry  Procedures:    Left Knee brace placement Subjective:   Today, patient was seen and examined at bedside.  Feels frustrated with life.  Stating that he does not want to continue to live longer.  Discharge Exam:   Vitals:   07/04/21 0959 07/04/21 1310  BP: 131/79 129/69  Pulse: 95 92  Resp: 20 20  Temp: 97.6 F (36.4 C) 98.6 F (37 C)  SpO2: 98% 96%   Vitals:   07/04/21 0250 07/04/21 0648 07/04/21 0959 07/04/21 1310  BP:  (!) 136/92 131/79 129/69  Pulse:  (!) 102 95 92  Resp: '14 18 20 20  ' Temp:  98 F (36.7 C) 97.6 F (36.4 C) 98.6 F (37 C)  TempSrc:  Oral Oral Oral  SpO2:  92% 98% 96%  Weight:      Height:       General: Alert awake, not in obvious distress anxious and mildly tearful but hard of hearing, obese HENT: pupils equally reacting to light,  No scleral pallor or icterus noted. Oral mucosa is moist.  Chest:  Clear breath sounds.  Diminished breath sounds bilaterally. No crackles or wheezes.  CVS: S1 &S2 heard. No murmur.  Regular rate and rhythm. Abdomen: Soft, nontender, nondistended.  Bowel sounds are heard.   Extremities: No cyanosis, clubbing or edema.  Peripheral pulses are palpable.  Left knee with brace. Psych: Alert, awake and oriented, normal mood CNS:  No cranial nerve deficits.  Power equal in all extremities.   Skin: Warm and dry.  No rashes noted.  The results of significant diagnostics from this hospitalization (including imaging, microbiology, ancillary and laboratory) are listed below for reference.     Diagnostic Studies:   DG Knee 2 Views Left  Result Date: 06/30/2021 CLINICAL DATA:  Left knee pain with erythema EXAM: LEFT KNEE - 1-2 VIEW COMPARISON:  None. FINDINGS: Diffuse osseous demineralization. There is a transversely oriented lucency within the mid pole of the patella with slight cortical disruption of the articular surface  suspicious for a nondisplaced fracture. Small knee joint effusion without visible fat-fluid level. Mild medial compartment joint space narrowing. Mild prepatellar soft tissue swelling. Prominent atherosclerotic vascular calcification. IMPRESSION: 1. Findings suggestive of a nondisplaced fracture through the mid pole of the patella. Correlate with point tenderness. 2. Small knee joint effusion. Electronically Signed   By: Davina Poke D.O.   On: 06/30/2021 12:15   DG Chest Port 1 View  Result Date: 06/30/2021 CLINICAL DATA:  Evaluate lung fields EXAM: PORTABLE CHEST 1 VIEW COMPARISON:  Chest x-ray dated December 11, 2020 FINDINGS: Cardiac and mediastinal contours are unchanged. Mild left basilar opacity. Possible small bilateral pleural effusions. No evidence of pneumothorax. IMPRESSION: Mild left basilar opacity, likely due to atelectasis. Small bilateral pleural effusions. Electronically Signed   By: Yetta Glassman M.D.   On: 06/30/2021 12:10     Labs:   Basic Metabolic Panel: Recent Labs  Lab 06/30/21 1205 07/01/21 0426 07/02/21 0414 07/03/21 0401  NA 135 134* 140 140  K 3.8 4.0 4.0 4.2  CL 99 100 106 109  CO2 '26 24 23 22  ' GLUCOSE  216* 163* 153* 178*  BUN 20 25* 21 20  CREATININE 0.72 0.75 0.84 0.81  CALCIUM 8.7* 8.5* 8.8* 9.1  MG  --   --  2.4 2.6*   GFR Estimated Creatinine Clearance: 48.1 mL/min (by C-G formula based on SCr of 0.81 mg/dL). Liver Function Tests: Recent Labs  Lab 06/30/21 1205 07/03/21 0401  AST 25 22  ALT 15 16  ALKPHOS 81 73  BILITOT 1.0 1.2  PROT 7.1 6.8  ALBUMIN 3.5 3.2*   No results for input(s): LIPASE, AMYLASE in the last 168 hours. No results for input(s): AMMONIA in the last 168 hours. Coagulation profile Recent Labs  Lab 06/30/21 1205  INR 1.6*    CBC: Recent Labs  Lab 06/30/21 1205 07/01/21 0426 07/02/21 0414 07/03/21 0401  WBC 12.2* 10.3 10.9* 11.6*  NEUTROABS 10.3* 7.7  --   --   HGB 11.3* 10.5* 10.5* 10.9*  HCT 36.7*  32.2* 33.1* 34.3*  MCV 92.9 89.7 90.2 90.0  PLT 236 217 234 267   Cardiac Enzymes: No results for input(s): CKTOTAL, CKMB, CKMBINDEX, TROPONINI in the last 168 hours. BNP: Invalid input(s): POCBNP CBG: Recent Labs  Lab 07/03/21 0722 07/03/21 1140 07/03/21 1630 07/04/21 0745 07/04/21 1135  GLUCAP 156* 184* 90 136* 142*   D-Dimer No results for input(s): DDIMER in the last 72 hours. Hgb A1c No results for input(s): HGBA1C in the last 72 hours. Lipid Profile No results for input(s): CHOL, HDL, LDLCALC, TRIG, CHOLHDL, LDLDIRECT in the last 72 hours. Thyroid function studies No results for input(s): TSH, T4TOTAL, T3FREE, THYROIDAB in the last 72 hours.  Invalid input(s): FREET3 Anemia work up No results for input(s): VITAMINB12, FOLATE, FERRITIN, TIBC, IRON, RETICCTPCT in the last 72 hours. Microbiology Recent Results (from the past 240 hour(s))  Culture, blood (Routine x 2)     Status: None (Preliminary result)   Collection Time: 06/30/21 12:03 PM   Specimen: BLOOD  Result Value Ref Range Status   Specimen Description   Final    BLOOD RIGHT ANTECUBITAL Performed at [facility] [address], [place], [place] [address]    Special Requests   Final    BOTTLES DRAWN AEROBIC AND ANAEROBIC Blood Culture results may not be optimal due to an excessive volume of blood received in culture bottles Performed at [facility] [address], [place], [place] [address]    Culture   Final    NO GROWTH 4 DAYS Performed at [facility], [place] [address] [place], [place] [address]    Report Status PENDING  Incomplete  Culture, blood (Routine x 2)     Status: None (Preliminary result)   Collection Time: 06/30/21 12:09 PM   Specimen: BLOOD  Result Value Ref Range Status   Specimen Description   Final    BLOOD LEFT ANTECUBITAL Performed at [facility] [address], [place], [place] [address]    Special Requests    Final    BOTTLES DRAWN AEROBIC AND ANAEROBIC Blood Culture results may not be optimal due to an excessive volume of blood received in culture bottles Performed at [facility] [address], [place], [place] [address]    Culture   Final    NO GROWTH 4 DAYS Performed at [facility], [place] [address], [place], [place] [address]    Report Status PENDING  Incomplete  Resp Panel by RT-PCR (Flu A&B, Covid) Nasopharyngeal Swab     Status:  None   Collection Time: 06/30/21  2:38 PM   Specimen: Nasopharyngeal Swab; Nasopharyngeal(NP) swabs in vial transport medium  Result Value Ref Range Status   SARS Coronavirus 2 by RT PCR NEGATIVE NEGATIVE Final    Comment: (NOTE) SARS-CoV-2 target nucleic acids are NOT DETECTED.  The SARS-CoV-2 RNA is generally detectable in upper respiratory specimens during the acute phase of infection. The lowest concentration of SARS-CoV-2 viral copies this assay can detect is 138 copies/mL. A negative result does not preclude SARS-Cov-2 infection and should not be used as the sole basis for treatment or other patient management decisions. A negative result may occur with  improper specimen collection/handling, submission of specimen other than nasopharyngeal swab, presence of viral mutation(s) within the areas targeted by this assay, and inadequate number of viral copies(<138 copies/mL). A negative result must be combined with clinical observations, patient history, and epidemiological information. The expected result is Negative.  Fact Sheet for Patients:  EntrepreneurPulse.com.au  Fact Sheet for Healthcare Providers:  IncredibleEmployment.be  This test is no t yet approved or cleared by the [place] FDA and  has been authorized for detection and/or diagnosis of SARS-CoV-2 by FDA under an Emergency Use Authorization (EUA). This EUA will remain  in effect (meaning this test can be used) for the  duration of the COVID-19 declaration under Section 564(b)(1) of the Act, 21 U.S.C.section 360bbb-3(b)(1), unless the authorization is terminated  or revoked sooner.       Influenza A by PCR NEGATIVE NEGATIVE Final   Influenza B by PCR NEGATIVE NEGATIVE Final    Comment: (NOTE) The Xpert Xpress SARS-CoV-2/FLU/RSV plus assay is intended as an aid in the diagnosis of influenza from Nasopharyngeal swab specimens and should not be used as a sole basis for treatment. Nasal washings and aspirates are unacceptable for Xpert Xpress SARS-CoV-2/FLU/RSV testing.  Fact Sheet for Patients: EntrepreneurPulse.com.au  Fact Sheet for Healthcare Providers: IncredibleEmployment.be  This test is not yet approved or cleared by the [place] FDA and has been authorized for detection and/or diagnosis of SARS-CoV-2 by FDA under an Emergency Use Authorization (EUA). This EUA will remain in effect (meaning this test can be used) for the duration of the COVID-19 declaration under Section 564(b)(1) of the Act, 21 U.S.C. section 360bbb-3(b)(1), unless the authorization is terminated or revoked.  Performed at [facility], [place] [address], [place], [place] [address]   Urine Culture     Status: None   Collection Time: 07/01/21  7:00 PM   Specimen: Urine, Clean Catch  Result Value Ref Range Status   Specimen Description   Final    URINE, CLEAN CATCH Performed at [facility], [place] [address] [place], [place] [address]    Special Requests   Final    NONE Performed at [facility], [place] [address], [place], [place] [address]    Culture   Final    NO GROWTH Performed at [facility], [place] [address] [place], [place] [address]    Report Status 07/03/2021 FINAL  Final     Discharge Instructions:   Discharge Instructions     Diet - low sodium heart healthy   Complete by: As directed    Diet Carb  Modified   Complete by: As directed    Discharge instructions   Complete by: As directed    Continue to use knee immobilizer but can weight-bear as tolerated.  Follow-up with orthopedics Dr. Mardelle Matte in 1 to 2 weeks for  the knee.   Discharge wound care:   Complete by: As directed    Continue pressure ulcer care on discharge.   Increase activity slowly   Complete by: As directed       Allergies as of 07/04/2021       Reactions   Chocolate Shortness Of Breath   Fish Allergy Anaphylaxis   Mixed Feathers Shortness Of Breath   Other Anaphylaxis   Flu shot causes Anaphylaxis   Shrimp Extract Allergy Skin Test    Iodine Rash        Medication List     TAKE these medications    acetaminophen 325 MG tablet Commonly known as: TYLENOL Take 650 mg by mouth in the morning, at noon, and at bedtime.   acetaminophen 650 MG CR tablet Commonly known as: TYLENOL Take 650 mg by mouth every 6 (six) hours as needed for pain.   alum & mag hydroxide-simeth 017-793-90 MG/5ML suspension Commonly known as: MAALOX PLUS Take 30 mLs by mouth every 4 (four) hours as needed for indigestion.   apixaban 5 MG Tabs tablet Commonly known as: ELIQUIS Take 5 mg by mouth 2 (two) times daily.   azelastine 0.05 % ophthalmic solution Commonly known as: OPTIVAR Place 1 drop into both eyes 2 (two) times daily.   carboxymethylcellulose 1 % ophthalmic solution Place 1 drop into both eyes 3 (three) times daily.   cephALEXin 500 MG capsule Commonly known as: KEFLEX Take 1 capsule (500 mg total) by mouth every 8 (eight) hours for 2 days.   Cranberry-Vitamin C-Vitamin E 4200-20-3 MG-MG-UNIT Caps Take 1 capsule by mouth daily.   diclofenac Sodium 1 % Gel Commonly known as: VOLTAREN Apply 4 g topically 3 (three) times daily as needed (bilateral knee pain).   finasteride 5 MG tablet Commonly known as: PROSCAR Take 5 mg by mouth at bedtime.   fluticasone 50 MCG/ACT nasal spray Commonly known as:  FLONASE Place 1 spray into both nostrils daily.   furosemide 40 MG tablet Commonly known as: LASIX Take 1 tablet (40 mg total) by mouth daily.   glipiZIDE 5 MG tablet Commonly known as: GLUCOTROL Take 5 mg by mouth daily.   latanoprost 0.005 % ophthalmic solution Commonly known as: XALATAN Place 1 drop into both eyes at bedtime.   lisinopril 10 MG tablet Commonly known as: ZESTRIL Take 1 tablet (10 mg total) by mouth daily.   loratadine 10 MG tablet Commonly known as: CLARITIN Take 10 mg by mouth daily.   menthol-cetylpyridinium 3 MG lozenge Commonly known as: CEPACOL Take 1 lozenge by mouth every 2 (two) hours as needed for sore throat.   metFORMIN 500 MG tablet Commonly known as: GLUCOPHAGE Take 500 mg by mouth in the morning and at bedtime.   methocarbamol 500 MG tablet Commonly known as: ROBAXIN Take 1 tablet (500 mg total) by mouth every 8 (eight) hours as needed for up to 5 days for muscle spasms.   morphine 20 MG/5ML solution Take 1.3 mLs (5.2 mg total) by mouth every 4 (four) hours as needed (chest pain or shortness of breath). What changed: how much to take   omeprazole 20 MG capsule Commonly known as: PRILOSEC Take 20 mg by mouth 2 (two) times daily.   oxyCODONE 5 MG immediate release tablet Commonly known as: Oxy IR/ROXICODONE Take 1 tablet (5 mg total) by mouth every 4 (four) hours as needed for severe pain.   polyethylene glycol 17 g packet Commonly known as: MIRALAX / GLYCOLAX Take 17  g by mouth daily.   Vitamin D3 50 MCG (2000 UT) Tabs Take 50 mcg by mouth daily.               Discharge Care Instructions  (From admission, onward)           Start     Ordered   07/04/21 0000  Discharge wound care:       Comments: Continue pressure ulcer care on discharge.   07/04/21 1444            Contact information for follow-up providers     Lajean Manes, MD .   Specialty: Internal Medicine Contact information: [address] [place] [address] [phone]         Marchia Bond, MD. Schedule an appointment as soon as possible for a visit in 2 week(s).   Specialty: Orthopedic Surgery Contact information: [place] [address] [phone]              Contact information for after-discharge care     Destination     [facility] .   Service: Skilled Nursing Contact information: [address] [phone]                      Time coordinating discharge: 39 minutes  Signed:  Carlesha Seiple  [facility] 07/04/2021, 2:44 PM

## 2021-07-04 NOTE — Plan of Care (Signed)
  Problem: Clinical Measurements: Goal: Respiratory complications will improve Outcome: Adequate for Discharge   Problem: Clinical Measurements: Goal: Cardiovascular complication will be avoided Outcome: Adequate for Discharge   Problem: Activity: Goal: Risk for activity intolerance will decrease Outcome: Adequate for Discharge   Problem: Elimination: Goal: Will not experience complications related to bowel motility Outcome: Adequate for Discharge   Problem: Pain Managment: Goal: General experience of comfort will improve Outcome: Adequate for Discharge   Problem: Skin Integrity: Goal: Risk for impaired skin integrity will decrease Outcome: Adequate for Discharge

## 2021-07-04 NOTE — TOC Transition Note (Addendum)
Transition of Care [facility]) - CM/SW Discharge Note   Patient Details  Name: Matthew Velez MRN: [id number] Date of Birth: 09-01-1919  [facility] of [facility]) CM/SW Contact:  Ross Ludwig, LCSW Phone Number: 07/04/2021, 2:51 PM   Clinical Narrative:     Patient requesting to return back to [facility] today with palliative to follow.  Patient to be d/c'ed today to [facility].  Patient and family agreeable to plans will transport via ems RN to call report to [phone].  Patient requesting to return back to SNF today, and CSW requested [facility] to follow patient for palliative at SNF.     Final next level of care: [place] Barriers to Discharge: Barriers Resolved   Patient Goals and CMS Choice Patient states their goals for this hospitalization and ongoing recovery are:: To return back to [facility]. CMS Medicare.gov Compare Post Acute Care list provided to:: Patient Choice offered to / list presented to : Patient  Discharge Placement   Existing PASRR number confirmed : 07/03/21          Patient chooses bed at: [facility] Patient to be transferred to facility by: [facility]   Patient and family notified of of transfer: 07/04/21  Discharge Plan and Services                                     Social Determinants of Health (SDOH) Interventions     Readmission Risk Interventions Readmission Risk Prevention Plan 12/13/2018  Post Dischage Appt Complete  Medication Screening Complete  Transportation Screening Complete  Some recent data might be hidden

## 2021-07-04 NOTE — Progress Notes (Signed)
Called report and spoke with RN at [facility].

## 2021-07-04 NOTE — TOC Progression Note (Signed)
Transition of Care [facility]) - Progression Note    Patient Details  Name: Matthew Velez MRN: [id number] Date of Birth: 05-Aug-1919  Transition of [facility]) CM/SW Contact  Ross Ludwig, [place] Phone Number: 07/04/2021, 9:33 AM  Clinical Narrative:     CSW called Bernadene Bell and verified that patient is managed by [facility].  CSW updated Tarri Glenn, they will start insurance authorization.        Expected Discharge Plan and Services                                                 Social Determinants of Health (SDOH) Interventions    Readmission Risk Interventions Readmission Risk Prevention Plan 12/13/2018  Post Dischage Appt Complete  Medication Screening Complete  Transportation Screening Complete  Some recent data might be hidden

## 2021-07-04 NOTE — Progress Notes (Signed)
[facility]) Hospital Liaison Note  Notified by Aggie Moats of patient/family request for [facility] Palliative services at [facility] after discharge.   [place] liaison will follow patient for discharge disposition.   Please call with any Hospice/Palliative related questions or concerns.   Thank you for the opportunity to participate in this patient's care  Jhonnie Garner RN, BSN, [facility] [phone]

## 2021-07-04 NOTE — Progress Notes (Signed)
Pt seemed very depress this morning he said multiple times "he wants to die". Pt refused to eat doesn't want to take any of his medications anymore. He even asked me twice to put a pillow on his face and let him die. MD notified.

## 2021-07-05 DIAGNOSIS — R1312 Dysphagia, oropharyngeal phase: Secondary | ICD-10-CM | POA: Diagnosis not present

## 2021-07-05 DIAGNOSIS — S82002D Unspecified fracture of left patella, subsequent encounter for closed fracture with routine healing: Secondary | ICD-10-CM | POA: Diagnosis not present

## 2021-07-05 DIAGNOSIS — I509 Heart failure, unspecified: Secondary | ICD-10-CM | POA: Diagnosis not present

## 2021-07-05 DIAGNOSIS — R0602 Shortness of breath: Secondary | ICD-10-CM | POA: Diagnosis not present

## 2021-07-05 DIAGNOSIS — M25561 Pain in right knee: Secondary | ICD-10-CM | POA: Diagnosis not present

## 2021-07-05 LAB — CULTURE, BLOOD (ROUTINE X 2)
Culture: NO GROWTH
Culture: NO GROWTH

## 2021-07-06 DIAGNOSIS — R093 Abnormal sputum: Secondary | ICD-10-CM | POA: Diagnosis not present

## 2021-07-06 DIAGNOSIS — R627 Adult failure to thrive: Secondary | ICD-10-CM | POA: Diagnosis not present

## 2021-07-06 DIAGNOSIS — Z515 Encounter for palliative care: Secondary | ICD-10-CM | POA: Diagnosis not present

## 2021-07-06 DIAGNOSIS — R52 Pain, unspecified: Secondary | ICD-10-CM | POA: Diagnosis not present

## 2021-07-07 DIAGNOSIS — R451 Restlessness and agitation: Secondary | ICD-10-CM | POA: Diagnosis not present

## 2021-07-07 DIAGNOSIS — Z79899 Other long term (current) drug therapy: Secondary | ICD-10-CM | POA: Diagnosis not present

## 2021-07-07 DIAGNOSIS — Z515 Encounter for palliative care: Secondary | ICD-10-CM | POA: Diagnosis not present

## 2021-07-07 DIAGNOSIS — R627 Adult failure to thrive: Secondary | ICD-10-CM | POA: Diagnosis not present

## 2021-07-07 DIAGNOSIS — D649 Anemia, unspecified: Secondary | ICD-10-CM | POA: Diagnosis not present

## 2021-07-07 DIAGNOSIS — R52 Pain, unspecified: Secondary | ICD-10-CM | POA: Diagnosis not present

## 2021-07-25 DEATH — deceased
